# Patient Record
Sex: Male | Born: 1955 | Race: Black or African American | Hispanic: No | Smoking: Never smoker
Health system: Southern US, Community
[De-identification: ages and names within clinical notes are randomized; demographics above are authoritative.]

## PROBLEM LIST (undated history)

## (undated) DIAGNOSIS — E119 Type 2 diabetes mellitus without complications: Secondary | ICD-10-CM

## (undated) DIAGNOSIS — B029 Zoster without complications: Secondary | ICD-10-CM

## (undated) DIAGNOSIS — I10 Essential (primary) hypertension: Secondary | ICD-10-CM

## (undated) DIAGNOSIS — I639 Cerebral infarction, unspecified: Secondary | ICD-10-CM

## (undated) HISTORY — DX: Cerebral infarction, unspecified: I63.9

---

## 2012-06-17 ENCOUNTER — Emergency Department (HOSPITAL_COMMUNITY)
Admission: EM | Admit: 2012-06-17 | Discharge: 2012-06-17 | Disposition: A | Discharge status: Home / Self Care | Payer: Self-pay | Source: Home / Self Care

## 2012-06-17 ENCOUNTER — Encounter (HOSPITAL_COMMUNITY): Payer: Self-pay

## 2012-06-17 DIAGNOSIS — E785 Hyperlipidemia, unspecified: Secondary | ICD-10-CM

## 2012-06-17 DIAGNOSIS — Z23 Encounter for immunization: Secondary | ICD-10-CM

## 2012-06-17 DIAGNOSIS — G4700 Insomnia, unspecified: Secondary | ICD-10-CM

## 2012-06-17 DIAGNOSIS — I10 Essential (primary) hypertension: Secondary | ICD-10-CM

## 2012-06-17 HISTORY — DX: Essential (primary) hypertension: I10

## 2012-06-17 LAB — LIPID PANEL
HDL: 56 mg/dL (ref >39)
LDL Cholesterol: 133 mg/dL — ABNORMAL HIGH (ref 0–99)
Total CHOL/HDL Ratio: 4.1 RATIO
Triglycerides: 208 mg/dL — ABNORMAL HIGH (ref <150)
VLDL: 42 mg/dL — ABNORMAL HIGH (ref 0–40)

## 2012-06-17 LAB — COMPREHENSIVE METABOLIC PANEL
AST: 44 U/L — ABNORMAL HIGH (ref 0–37)
Albumin: 4.2 g/dL (ref 3.5–5.2)
Calcium: 10 mg/dL (ref 8.4–10.5)
Chloride: 96 mEq/L (ref 96–112)
Creatinine, Ser: 1.45 mg/dL — ABNORMAL HIGH (ref 0.50–1.35)
Total Protein: 8.3 g/dL (ref 6.0–8.3)

## 2012-06-17 MED ORDER — PRAVASTATIN SODIUM 20 MG PO TABS: 20.0000 mg | ORAL_TABLET | Freq: Every day | ORAL | Status: DC

## 2012-06-17 MED ORDER — ATENOLOL 50 MG PO TABS: 50.0000 mg | ORAL_TABLET | Freq: Every day | ORAL | Status: DC

## 2012-06-17 MED ORDER — LISINOPRIL-HYDROCHLOROTHIAZIDE 20-25 MG PO TABS: 1.0000 | ORAL_TABLET | Freq: Every day | ORAL | Status: DC

## 2012-06-17 MED ORDER — TRAZODONE HCL 100 MG PO TABS: 100.0000 mg | ORAL_TABLET | Freq: Every day | ORAL | Status: DC

## 2012-06-17 MED ORDER — INFLUENZA VIRUS VACC SPLIT PF IM SUSP
0.5000 mL | Freq: Once | INTRAMUSCULAR | Status: AC
  Administered 2012-06-17: 0.5 mL via INTRAMUSCULAR

## 2012-06-17 NOTE — ED Provider Notes (Signed)
History    CSN: [id number]  Arrival date & time 06/17/12  1700  Chief Complaint  Patient presents with  . Medication Refill   HPI Pt says he is out of all of his medications.  He has been treated for lipids and blood pressure.  The patient says that he tolerates his medications very well with no significant side effects.  Past Medical History  Diagnosis Date  . Hypertension    History reviewed. No pertinent past surgical history.  No family history on file.  History  Substance Use Topics  . Smoking status: Never Smoker   . Smokeless tobacco: Not on file  . Alcohol Use: No    Review of Systems  Constitutional: Negative.   HENT: Negative.   Respiratory: Negative.   Cardiovascular: Negative.   Gastrointestinal: Negative.   Musculoskeletal: Negative.   Neurological: Negative.   Hematological: Negative.   Psychiatric/Behavioral: Negative.   All other systems reviewed and are negative.   Allergies  Penicillins  Home Medications   Current Outpatient Rx  Name  Route  Sig  Dispense  Refill  . ATENOLOL 50 MG PO TABS   Oral   Take 50 mg by mouth daily.         [name] LISINOPRIL-HYDROCHLOROTHIAZIDE 20-25 MG PO TABS   Oral   Take 1 tablet by mouth daily.         [name] PRAVASTATIN SODIUM 20 MG PO TABS   Oral   Take 20 mg by mouth daily.         . TRAZODONE HCL 100 MG PO TABS   Oral   Take 100 mg by mouth at bedtime.           BP 140/83  Pulse 85  Temp 98.2 F (36.8 C) (Oral)  Resp 19  SpO2 99%  Physical Exam  Nursing note and vitals reviewed. Constitutional: He is oriented to person, place, and time. He appears well-developed and well-nourished. No distress.  HENT:  Head: Normocephalic and atraumatic.  Eyes: EOM are normal. Pupils are equal, round, and reactive to light.  Neck: Normal range of motion. Neck supple.  Cardiovascular: Normal rate, regular rhythm and normal heart sounds.   Pulmonary/Chest: Effort normal.  Abdominal: Soft. Bowel sounds are  normal.  Musculoskeletal: Normal range of motion. He exhibits no edema and no tenderness.  Neurological: He is alert and oriented to person, place, and time. He has normal reflexes.  Skin: Skin is warm and dry. No rash noted. No erythema. No pallor.  Psychiatric: He has a normal mood and affect. His behavior is normal. Judgment and thought content normal.    ED Course  Procedures (including critical care time)  Labs Reviewed - No data to display No results found.  No diagnosis found.  MDM  IMPRESSION  HTN  Hyperlipidemia  Health maintenance  RECOMMENDATIONS / PLAN Check labs today  Refilled meds  Flu vaccine ordered for today. Followup on results.  FOLLOW UP 3 months  The patient was given clear instructions to go to ER or return to medical center if symptoms don't improve, worsen or new problems develop.  The patient verbalized understanding.  The patient was told to call to get lab results if they haven't heard anything in the next week.            Cleora Fleet, MD 06/17/12 1755

## 2012-06-17 NOTE — ED Notes (Signed)
[facility]- needs medication refill has history of hypertension elevated cholesterol

## 2012-06-18 NOTE — Progress Notes (Signed)
Quick Note:  Please notify patient that his kidney test came back abnormal. This is consistent with mild renal insufficiency. Also, his liver enzymes were mildly elevated as well. I recommended rechecking his blood work in 3 months. Continue taking current medications. The patient's cholesterol is also elevated. I recommend low fat low cholesterol diet.   Rodney Langton, MD, CDE, FAAFP [facility] [place], [place]   ______

## 2012-06-19 ENCOUNTER — Telehealth (HOSPITAL_COMMUNITY): Payer: Self-pay

## 2012-06-19 NOTE — Telephone Encounter (Signed)
Message copied by Lestine Mount on Wed Jun 19, 2012 11:03 AM ------      Message from: Cleora Fleet      Created: Tue Jun 18, 2012  6:42 PM       Please notify patient that his kidney test came back abnormal.  This is consistent with mild renal insufficiency.  Also, his liver enzymes were mildly elevated as well.  I recommended rechecking his blood work in 3 months.  Continue taking current medications.  The patient's cholesterol is also elevated.  I recommend low fat low cholesterol diet.                  Rodney Langton, MD, CDE, FAAFP      [facility]      [facility]      [place], [place]

## 2012-06-19 NOTE — Telephone Encounter (Signed)
Message copied by Lestine Mount on Wed Jun 19, 2012 11:05 AM ------      Message from: Cleora Fleet      Created: Tue Jun 18, 2012  6:42 PM       Please notify patient that his kidney test came back abnormal.  This is consistent with mild renal insufficiency.  Also, his liver enzymes were mildly elevated as well.  I recommended rechecking his blood work in 3 months.  Continue taking current medications.  The patient's cholesterol is also elevated.  I recommend low fat low cholesterol diet.                  Rodney Langton, MD, CDE, FAAFP      [facility]      [facility]      [place], [place]

## 2014-08-25 ENCOUNTER — Emergency Department (HOSPITAL_COMMUNITY)
Admission: EM | Admit: 2014-08-25 | Discharge: 2014-08-25 | Disposition: A | Discharge status: Home / Self Care | Payer: No Typology Code available for payment source | Attending: Emergency Medicine | Admitting: Emergency Medicine

## 2014-08-25 ENCOUNTER — Encounter (HOSPITAL_COMMUNITY): Payer: Self-pay

## 2014-08-25 DIAGNOSIS — Z880 Allergy status to penicillin: Secondary | ICD-10-CM | POA: Insufficient documentation

## 2014-08-25 DIAGNOSIS — M62838 Other muscle spasm: Secondary | ICD-10-CM | POA: Diagnosis not present

## 2014-08-25 DIAGNOSIS — Y9389 Activity, other specified: Secondary | ICD-10-CM | POA: Diagnosis not present

## 2014-08-25 DIAGNOSIS — Y998 Other external cause status: Secondary | ICD-10-CM | POA: Diagnosis not present

## 2014-08-25 DIAGNOSIS — Y92410 Unspecified street and highway as the place of occurrence of the external cause: Secondary | ICD-10-CM | POA: Diagnosis not present

## 2014-08-25 DIAGNOSIS — Z79899 Other long term (current) drug therapy: Secondary | ICD-10-CM | POA: Diagnosis not present

## 2014-08-25 DIAGNOSIS — S199XXA Unspecified injury of neck, initial encounter: Secondary | ICD-10-CM | POA: Insufficient documentation

## 2014-08-25 DIAGNOSIS — I10 Essential (primary) hypertension: Secondary | ICD-10-CM | POA: Insufficient documentation

## 2014-08-25 DIAGNOSIS — S46811A Strain of other muscles, fascia and tendons at shoulder and upper arm level, right arm, initial encounter: Secondary | ICD-10-CM

## 2014-08-25 DIAGNOSIS — S29012A Strain of muscle and tendon of back wall of thorax, initial encounter: Secondary | ICD-10-CM | POA: Insufficient documentation

## 2014-08-25 DIAGNOSIS — S4991XA Unspecified injury of right shoulder and upper arm, initial encounter: Secondary | ICD-10-CM | POA: Diagnosis present

## 2014-08-25 MED ORDER — CYCLOBENZAPRINE HCL 10 MG PO TABS: 10.0000 mg | ORAL_TABLET | Freq: Three times a day (TID) | ORAL | Status: DC | PRN

## 2014-08-25 MED ORDER — NAPROXEN 500 MG PO TABS
500.0000 mg | ORAL_TABLET | Freq: Once | ORAL | Status: AC
  Administered 2014-08-25: 500 mg via ORAL
  Filled 2014-08-25: qty 1

## 2014-08-25 MED ORDER — HYDROCODONE-ACETAMINOPHEN 5-325 MG PO TABS: 1.0000 | ORAL_TABLET | Freq: Four times a day (QID) | ORAL | Status: DC | PRN

## 2014-08-25 MED ORDER — NAPROXEN 500 MG PO TABS: 500.0000 mg | ORAL_TABLET | Freq: Two times a day (BID) | ORAL | Status: DC | PRN
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
## 2014-08-27 ENCOUNTER — Emergency Department (HOSPITAL_COMMUNITY)
Admission: EM | Admit: 2014-08-27 | Discharge: 2014-08-27 | Discharge status: Absent without leave | Payer: Self-pay | Source: Home / Self Care

## 2014-08-29 ENCOUNTER — Encounter (HOSPITAL_COMMUNITY): Payer: Self-pay | Admitting: *Deleted

## 2014-08-29 ENCOUNTER — Emergency Department (HOSPITAL_COMMUNITY)
Admission: EM | Admit: 2014-08-29 | Discharge: 2014-08-29 | Disposition: A | Discharge status: Home / Self Care | Payer: Self-pay | Attending: Emergency Medicine | Admitting: Emergency Medicine

## 2014-08-29 ENCOUNTER — Emergency Department (HOSPITAL_COMMUNITY): Payer: Self-pay

## 2014-08-29 DIAGNOSIS — S161XXD Strain of muscle, fascia and tendon at neck level, subsequent encounter: Secondary | ICD-10-CM | POA: Insufficient documentation

## 2014-08-29 DIAGNOSIS — Z79899 Other long term (current) drug therapy: Secondary | ICD-10-CM | POA: Insufficient documentation

## 2014-08-29 DIAGNOSIS — I10 Essential (primary) hypertension: Secondary | ICD-10-CM | POA: Insufficient documentation

## 2014-08-29 DIAGNOSIS — M62838 Other muscle spasm: Secondary | ICD-10-CM | POA: Insufficient documentation

## 2014-08-29 DIAGNOSIS — Z880 Allergy status to penicillin: Secondary | ICD-10-CM | POA: Insufficient documentation

## 2014-08-29 MED ORDER — HYDROCODONE-ACETAMINOPHEN 5-325 MG PO TABS
1.0000 | ORAL_TABLET | Freq: Once | ORAL | Status: AC
Start: 2014-08-29 — End: 2014-08-29
  Administered 2014-08-29: 1 via ORAL
  Filled 2014-08-29: qty 1

## 2014-08-29 MED ORDER — HYDROCODONE-ACETAMINOPHEN 5-325 MG PO TABS: 1.0000 | ORAL_TABLET | ORAL | Status: DC | PRN
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
## 2014-08-29 NOTE — Discharge Instructions (Signed)
Return to the ED with any concerns including weakness of arms or legs, difficulty breathing, swelling of arms, decreased level of alertness/lethargy, or any other alarming symptoms

## 2015-05-29 ENCOUNTER — Encounter (HOSPITAL_COMMUNITY): Payer: Self-pay | Admitting: Emergency Medicine

## 2015-05-29 ENCOUNTER — Emergency Department (HOSPITAL_COMMUNITY)
Admission: EM | Admit: 2015-05-29 | Discharge: 2015-05-29 | Disposition: A | Discharge status: Home / Self Care | Payer: No Typology Code available for payment source | Attending: Emergency Medicine | Admitting: Emergency Medicine

## 2015-05-29 DIAGNOSIS — S161XXA Strain of muscle, fascia and tendon at neck level, initial encounter: Secondary | ICD-10-CM | POA: Diagnosis not present

## 2015-05-29 DIAGNOSIS — Y92410 Unspecified street and highway as the place of occurrence of the external cause: Secondary | ICD-10-CM | POA: Insufficient documentation

## 2015-05-29 DIAGNOSIS — Y998 Other external cause status: Secondary | ICD-10-CM | POA: Diagnosis not present

## 2015-05-29 DIAGNOSIS — Z79899 Other long term (current) drug therapy: Secondary | ICD-10-CM | POA: Insufficient documentation

## 2015-05-29 DIAGNOSIS — Y9389 Activity, other specified: Secondary | ICD-10-CM | POA: Diagnosis not present

## 2015-05-29 DIAGNOSIS — Z880 Allergy status to penicillin: Secondary | ICD-10-CM | POA: Insufficient documentation

## 2015-05-29 DIAGNOSIS — S199XXA Unspecified injury of neck, initial encounter: Secondary | ICD-10-CM | POA: Diagnosis present

## 2015-05-29 DIAGNOSIS — I10 Essential (primary) hypertension: Secondary | ICD-10-CM | POA: Insufficient documentation

## 2015-05-29 DIAGNOSIS — S134XXA Sprain of ligaments of cervical spine, initial encounter: Secondary | ICD-10-CM | POA: Diagnosis not present

## 2015-05-29 MED ORDER — HYDROCODONE-ACETAMINOPHEN 5-325 MG PO TABS: ORAL_TABLET | ORAL | Status: DC

## 2015-05-29 MED ORDER — IBUPROFEN 200 MG PO TABS
400.0000 mg | ORAL_TABLET | Freq: Once | ORAL | Status: AC
  Administered 2015-05-29: 400 mg via ORAL
  Filled 2015-05-29: qty 2
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
## 2015-05-29 NOTE — Discharge Instructions (Signed)
Take vicodin for breakthrough pain, do not drink alcohol, drive, care for children or do other critical tasks while taking vicodin.  Do not hesitate to return to the emergency room for any new, worsening or concerning symptoms.  Please obtain primary care using resource guide below. Let them know that you were seen in the emergency room and that they will need to obtain records for further outpatient management.     Cervical Sprain A cervical sprain is when the tissues (ligaments) that hold the neck bones in place stretch or tear. HOME CARE   Put ice on the injured area.  Put ice in a plastic bag.  Place a towel between your skin and the bag.  Leave the ice on for 15-20 minutes, 3-4 times a day.  You may have been given a collar to wear. This collar keeps your neck from moving while you heal.  Do not take the collar off unless told by your doctor.  If you have long hair, keep it outside of the collar.  Ask your doctor before changing the position of your collar. You may need to change its position over time to make it more comfortable.  If you are allowed to take off the collar for cleaning or bathing, follow your doctor's instructions on how to do it safely.  Keep your collar clean by wiping it with mild soap and water. Dry it completely. If the collar has removable pads, remove them every 1-2 days to hand wash them with soap and water. Allow them to air dry. They should be dry before you wear them in the collar.  Do not drive while wearing the collar.  Only take medicine as told by your doctor.  Keep all doctor visits as told.  Keep all physical therapy visits as told.  Adjust your work station so that you have good posture while you work.  Avoid positions and activities that make your problems worse.  Warm up and stretch before being active. GET HELP IF:  Your pain is not controlled with medicine.  You cannot take less pain medicine over time as planned.  Your  activity level does not improve as expected. GET HELP RIGHT AWAY IF:   You are bleeding.  Your stomach is upset.  You have an allergic reaction to your medicine.  You develop new problems that you cannot explain.  You lose feeling (become numb) or you cannot move any part of your body (paralysis).  You have tingling or weakness in any part of your body.  Your symptoms get worse. Symptoms include:  Pain, soreness, stiffness, puffiness (swelling), or a burning feeling in your neck.  Pain when your neck is touched.  Shoulder or upper back pain.  Limited ability to move your neck.  Headache.  Dizziness.  Your hands or arms feel week, lose feeling, or tingle.  Muscle spasms.  Difficulty swallowing or chewing. MAKE SURE YOU:   Understand these instructions.  Will watch your condition.  Will get help right away if you are not doing well or get worse.   This information is not intended to replace advice given to you by your health care provider. Make sure you discuss any questions you have with your health care provider.   Document Released: 11/08/2007 Document Revised: 01/22/2013 Document Reviewed: 11/27/2012 Elsevier Interactive Patient Education 2016 [employer]   Emergency Department Resource Guide 1) Find a Doctor and Pay Out of Pocket Although you won't have to find out who is covered by  your insurance plan, it is a good idea to ask around and get recommendations. You will then need to call the office and see if the doctor you have chosen will accept you as a new patient and what types of options they offer for patients who are self-pay. Some doctors offer discounts or will set up payment plans for their patients who do not have insurance, but you will need to ask so you aren't surprised when you get to your appointment.  2) Contact Your Local Health Department Not all health departments have doctors that can see patients for sick visits, but many do, so it is  worth a call to see if yours does. If you don't know where your local health department is, you can check in your phone book. The CDC also has a tool to help you locate your state's health department, and many state websites also have listings of all of their local health departments.  3) Find a Walk-in Clinic If your illness is not likely to be very severe or complicated, you may want to try a walk in clinic. These are popping up all over the country in pharmacies, drugstores, and shopping centers. They're usually staffed by nurse practitioners or physician assistants that have been trained to treat common illnesses and complaints. They're usually fairly quick and inexpensive. However, if you have serious medical issues or chronic medical problems, these are probably not your best option.  No Primary Care Doctor: - Call Health Connect at  [phone] - they can help you locate a primary care doctor that  accepts your insurance, provides certain services, etc. - Physician Referral Service- [phone]  Chronic Pain Problems: Organization         Address  Phone   Notes  [facility]  [phone] Patients need to be referred by their primary care doctor.   Medication Assistance: Organization         Address  Phone   Notes  [facility] Medication [facility] [address] [place] [place], [place] [address] [phone] --Must be a resident of [facility] -- Must have NO insurance coverage whatsoever (no Medicaid/ Medicare, etc.) -- The pt. MUST have a primary care doctor that directs their care regularly and follows them in the community   [facility]  [phone]   [employer]  [phone]    Agencies that provide inexpensive medical care: Organization         Address  Phone   Notes  [facility]  [phone]   [facility]    [phone]   [facility] [address] [place],  [place] [address] [phone]   [facility] [address] [place] [address] [place] [phone]   Planned Parenthood    [phone]   [facility]    [phone]   [facility]  [address], [place] Phone:  [phone], Fax:  [phone] Hours of Operation:  9 am - 6 pm, M-F.  Also accepts Medicaid/Medicare and self-pay.  [facility]  [address], [place] Phone: [phone], Fax: [phone]. Hours of Operation:  8:30 am - 5:30 pm, M-F.  Also accepts Medicaid and self-pay.  [facility] [place] [address] [place] Phone: [phone]   [facility] [address] Natasha Bence [place], [place] [phone], Ext. [phone] Mondays &  Thursdays: 7-9 AM.  First 15 patients are seen on a first come, first serve basis.    Medicaid-accepting [facility] Providers:  Organization         Address  Phone   Notes  [facility] [address], [place] [phone] Also accepts self-pay patients.  [facility] [address] Laurell Josephs [place], [place]  [phone]   [facility] [address], [place] [phone]   [facility] [address] [place] [phone]   Renaye Rakers [address], [place]   [phone] Only accepts [place] Access [place] patients after they have their name applied to their card.   Self-Pay (no insurance) in [facility]:  Organization         Address  Phone   Notes  Sickle Cell Patients, [facility] [address] [place], [place] [phone]   [facility] [address] [place], [place] [phone]   [facility] [place]  [address], [place] [phone]   [facility]/Dr. Osei-Bonsu  [address] [place] or [address], [place] [phone] Phone number for both [place] and [place] locations is the same.  [facility] and [facility] [address], [place] [phone]   [facility] [place] [address] [place] or [address] [phone]   [facility] [address] [place] [phone], phone; [phone], fax Sees patients 1st and 3rd Saturday of every month.  Must not qualify for public or private insurance (i.e. Medicaid, Medicare, [place] Health Choice, Veterans' Benefits)  Household income should be no more than 200% of the poverty level The clinic cannot treat you if you are pregnant or think you are pregnant  Sexually transmitted diseases are not treated at the clinic.    Dental Care: Organization         Address  Phone  Notes  [facility] Department of [facility] [address] [place], [place] [phone] Accepts children up to age 47 who are enrolled in [place] or [place] Health Choice; pregnant women with a Medicaid card; and children who have applied for Medicaid or [place] Health Choice, but were declined, whose parents can pay a reduced fee at time of service.  [facility] Department of [facility]  [address], [place] [phone] Accepts children up to age 84 who are enrolled in [place] or [place] Health Choice; pregnant women with a Medicaid card; and children who have applied for Medicaid or [place] Health Choice, but were declined, whose parents can pay a reduced fee at time of service.  [facility]  [address] [place], [place] [phone] Patients are seen by appointment only. Walk-ins are not accepted. [facility] will see patients 57 years of age and older. Monday - Tuesday (8am-5pm) Most Wednesdays (8:30-5pm) $30 per visit, cash only  [facility]  [address], [facility] [phone]  Patients are seen by appointment only. Walk-ins are not accepted. [facility] will see patients 88 years of age and older. One Wednesday Evening (Monthly: Volunteer Based).  $30 per visit, cash only  [employer]  [phone] for adults; Children under  age 68, call Graduate Pediatric Dentistry at [phone]. Children aged 90-14, please call [phone] to request a pediatric application.  Dental services are provided in all areas of dental care including fillings, crowns and bridges, complete and partial dentures, implants, gum treatment, root canals, and extractions. Preventive care is also provided. Treatment is provided to both adults and children. Patients are selected via a lottery and there is often a waiting list.   [facility] [address], [place]  [phone] [email or web address]   [facility] [address] [place], [place] [phone], Ext. [phone] Second and Fourth Thursday of each month, opens at 6:30 AM; Clinic ends at 9 AM.  Patients are seen on a first-come first-served basis, and a limited number are seen during each clinic.   [facility]  [address] Ether Griffins [place], [place] [phone]   Eligibility Requirements You must have lived in [place], [place], or [place] counties for at least the last three months.   You cannot be eligible for state or federal sponsored [place], including [employer], [place], or [employer].   You generally cannot be eligible for healthcare insurance through your employer.    How to apply: Eligibility screenings are held every Tuesday and Wednesday afternoon from 1:00 pm until 4:00 pm. You do not need an appointment for the interview!  [facility] [address] [place], [place] [phone]   [facility] Health Department  [phone]   [facility] Health Department  [phone]   [facility] Health Department   [phone]    Behavioral Health Resources in the Community: Intensive Outpatient Programs Organization         Address  Phone  Notes  [facility] Services [address] [place], [place] [phone]   [facility] [address], [place], [place] [phone]   [facility] [address], [place], [place]  [phone]   [facility] [address],  [place], [place] [phone] or [phone]   Substance Abuse Resources Organization         Address  Phone  Notes  [facility]  [phone]   [facility]  [phone]   [facility]  [phone]   Floydene Flock  [phone]   Residential & Outpatient Substance Abuse Program  7012752424   Psychological Services Organization         Address  Phone  Notes  [facility] Behavioral Health  [phone]   [facility] Services  [phone]   [facility] Mental Health [address], [place] [phone] or [phone]    Mobile Crisis Teams Organization         Address  Phone  Notes  [facility], Mobile Crisis Care Unit  [phone]   [facility]  [address] [place], [place] [phone]   Doristine Locks [address] [place] [phone]    Self-Help/Support Groups Organization         Address  Phone             Notes  [facility] [place] - variety of support groups  336- I7437963 Call for more information  Narcotics Anonymous (NA), [facility] [address], [employer] [place]  2 meetings at this location   Chief Executive Officer  Notes  [facility] [phone] [address]  Lynne [place]  [phone]   [facility]  [address] [place] [address], [place], [place] [phone]   [facility] [address] [place], [place] [phone] Admissions: 8am-3pm M-F   [facility] [address]    [place], [place] [phone]   [facility] [address] [place] [address] [place], [place], [place] [phone]   [facility] [address]  [place], [place] [phone]   [facility] - Intensive Outpatient [address] Laurell Josephs [address], [place], [place] [phone]   [facility] ([facility] [address] [place].,  [place], [place] [phone] or [phone]   [facility] ([facility]) [address], [place], [place] [phone] Accepts Medicaid  [facility] [place] [address]  [place] [phone] Substance Abuse/Addiction Treatment   [facility] Organization         Address  Phone  Notes  [facility]  [phone]   Angie Fava, PhD [address] Ervin Knack [place], [place]   [phone] or [phone]   [facility] Behavioral   [address] [place], [place] [phone]   [facility] [address] [place], [place] [phone] Insurance/Medicaid/sponsorship through [facility] and Families [address]                                    [place], [place] [phone] Therapy/tele-psych/case  [facility] [address] [place], [place] [phone]    Dr. Lolly Mustache  [phone]   [facility]  [employer] [facility] Dept. 1) [address] [place] 2) [address] [place] 3)  [address], [place] [phone]  [phone]   [facility] Child Abuse Hotline [phone] or [phone] (After Hours)
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
## 2015-05-29 NOTE — ED Notes (Signed)
Pt ambulatory to fast track
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
## 2015-08-07 ENCOUNTER — Emergency Department (HOSPITAL_COMMUNITY)
Admission: EM | Admit: 2015-08-07 | Discharge: 2015-08-07 | Disposition: A | Discharge status: Home / Self Care | Payer: No Typology Code available for payment source | Attending: Emergency Medicine | Admitting: Emergency Medicine

## 2015-08-07 ENCOUNTER — Encounter (HOSPITAL_COMMUNITY): Payer: Self-pay

## 2015-08-07 DIAGNOSIS — B029 Zoster without complications: Secondary | ICD-10-CM | POA: Insufficient documentation

## 2015-08-07 DIAGNOSIS — Z880 Allergy status to penicillin: Secondary | ICD-10-CM | POA: Insufficient documentation

## 2015-08-07 DIAGNOSIS — Z79899 Other long term (current) drug therapy: Secondary | ICD-10-CM | POA: Insufficient documentation

## 2015-08-07 DIAGNOSIS — I10 Essential (primary) hypertension: Secondary | ICD-10-CM | POA: Insufficient documentation

## 2015-08-07 MED ORDER — ACYCLOVIR 800 MG PO TABS: 800.0000 mg | ORAL_TABLET | Freq: Every day | ORAL | Status: DC

## 2015-08-07 MED ORDER — VALACYCLOVIR HCL 1 G PO TABS: 1000.0000 mg | ORAL_TABLET | Freq: Three times a day (TID) | ORAL | Status: DC

## 2015-08-07 MED ORDER — OXYCODONE-ACETAMINOPHEN 5-325 MG PO TABS: 1.0000 | ORAL_TABLET | ORAL | Status: DC | PRN

## 2015-08-07 NOTE — Discharge Instructions (Signed)

## 2015-08-07 NOTE — ED Notes (Signed)
He c/o painful "burning" rash at right lat. Thoracic/ribs area.  He states he is otherwise healthy.  He is in no distress.

## 2015-08-07 NOTE — ED Provider Notes (Signed)
CSN: [id number]     Arrival date & time 08/07/15  1333 History   None    Chief Complaint  Patient presents with  . Rash     (Consider location/radiation/quality/duration/timing/severity/associated sxs/prior Treatment) Patient is a 60 y.o. male presenting with rash. The history is provided by the patient.  Rash Location:  Torso Torso rash location:  R chest and upper back Quality: blistering and painful   Pain details:    Quality:  Burning   Severity:  Severe   Onset quality:  Gradual   Duration:  3 days   Timing:  Constant   Progression:  Worsening Onset quality:  Gradual Duration:  3 days Chronicity:  New Context: not animal contact, not chemical exposure, not diapers, not eggs, not exposure to similar rash, not food, not hot tub use, not insect bite/sting, not medications, not new detergent/soap, not nuts, not plant contact, not pollen, not pregnancy, not sick contacts and not sun exposure   Relieved by:  Nothing Worsened by:  Contact Associated symptoms: no abdominal pain, no diarrhea, no fatigue, no fever, no headaches, no hoarse voice, no induration, no nausea, no periorbital edema, no shortness of breath, no sore throat, no throat swelling, no tongue swelling, no URI, not vomiting and not wheezing     Past Medical History  Diagnosis Date  . Hypertension    No past surgical history on file. No family history on file. Social History  Substance Use Topics  . Smoking status: Never Smoker   . Smokeless tobacco: None  . Alcohol Use: No    Review of Systems  Constitutional: Negative for fever and fatigue.  HENT: Negative for hoarse voice and sore throat.   Respiratory: Negative for shortness of breath and wheezing.   Gastrointestinal: Negative for nausea, vomiting, abdominal pain and diarrhea.  Skin: Positive for rash.  Neurological: Negative for headaches.  All other systems reviewed and are negative.     Allergies  Penicillins  Home Medications   Prior to  Admission medications   Medication Sig Start Date End Date Taking? Authorizing Provider  atenolol (TENORMIN) 50 MG tablet Take 1 tablet (50 mg total) by mouth daily. 06/17/12   Clanford Cyndie MullL Johnson, MD  cyclobenzaprine (FLEXERIL) 10 MG tablet Take 1 tablet (10 mg total) by mouth 3 (three) times daily as needed for muscle spasms. 08/25/14   Mercedes Camprubi-Soms, PA-C  HYDROcodone-acetaminophen (NORCO/VICODIN) 5-325 MG tablet Take 1-2 tablets by mouth every 6 hours as needed for pain and/or cough. 05/29/15   Nicole Pisciotta, PA-C  lisinopril-hydrochlorothiazide (PRINZIDE,ZESTORETIC) 20-25 MG per tablet Take 1 tablet by mouth daily. Patient not taking: Reported on 08/25/2014 06/17/12   Clanford Cyndie MullL Johnson, MD  naproxen (NAPROSYN) 500 MG tablet Take 1 tablet (500 mg total) by mouth 2 (two) times daily as needed for mild pain, moderate pain or headache (TAKE WITH MEALS.). Patient not taking: Reported on 08/29/2014 08/25/14   Mercedes Camprubi-Soms, PA-C  pravastatin (PRAVACHOL) 20 MG tablet Take 1 tablet (20 mg total) by mouth daily. 06/17/12   Clanford Cyndie MullL Johnson, MD  traZODone (DESYREL) 100 MG tablet Take 1 tablet (100 mg total) by mouth at bedtime. Patient taking differently: Take 100 mg by mouth at bedtime as needed for sleep.  06/17/12   Clanford L Johnson, MD   BP 142/90 mmHg  Pulse 101  Temp(Src) 97.6 F (36.4 C) (Oral)  Resp 18  SpO2 98% Physical Exam  Constitutional: He appears well-developed and well-nourished. No distress.  HENT:  Head: Normocephalic and  atraumatic.  Eyes: Conjunctivae are normal. No scleral icterus.  Neck: Normal range of motion. Neck supple.  Cardiovascular: Normal rate, regular rhythm and normal heart sounds.   Pulmonary/Chest: Effort normal and breath sounds normal. No respiratory distress.      Patches of vesicular and crusting eruptions on the R side in the T7/T8 dermatome. No signs of secondary infection.  Abdominal: Soft. There is no tenderness.    Musculoskeletal: He exhibits no edema.  Neurological: He is alert.  Skin: Skin is warm and dry. He is not diaphoretic.  Psychiatric: His behavior is normal.  Nursing note and vitals reviewed.   ED Course  Procedures (including critical care time) Labs Review Labs Reviewed - No data to display  Imaging Review No results found. I have personally reviewed and evaluated these images and lab results as part of my medical decision-making.   EKG Interpretation None      MDM   Final diagnoses:  Shingles outbreak    3:28 PM BP 155/78 mmHg  Pulse 77  Temp(Src) 98 F (36.7 C) (Oral)  Resp 16  SpO2 99%  Patient discharged with zovirax and pain meds. Discussed return precautions. Appears safe for discharge.    Arthor Captain, PA-C 08/07/15 1529  Lavera Guise, MD 08/07/15 [phone]

## 2015-08-29 ENCOUNTER — Encounter (HOSPITAL_COMMUNITY): Payer: Self-pay

## 2015-08-29 ENCOUNTER — Emergency Department (HOSPITAL_COMMUNITY)
Admission: EM | Admit: 2015-08-29 | Discharge: 2015-08-29 | Disposition: A | Discharge status: Home / Self Care | Payer: No Typology Code available for payment source | Attending: Emergency Medicine | Admitting: Emergency Medicine

## 2015-08-29 DIAGNOSIS — B029 Zoster without complications: Secondary | ICD-10-CM | POA: Insufficient documentation

## 2015-08-29 DIAGNOSIS — I10 Essential (primary) hypertension: Secondary | ICD-10-CM | POA: Insufficient documentation

## 2015-08-29 DIAGNOSIS — R0789 Other chest pain: Secondary | ICD-10-CM | POA: Insufficient documentation

## 2015-08-29 DIAGNOSIS — Z880 Allergy status to penicillin: Secondary | ICD-10-CM | POA: Insufficient documentation

## 2015-08-29 DIAGNOSIS — M792 Neuralgia and neuritis, unspecified: Secondary | ICD-10-CM

## 2015-08-29 DIAGNOSIS — Z8619 Personal history of other infectious and parasitic diseases: Secondary | ICD-10-CM | POA: Insufficient documentation

## 2015-08-29 DIAGNOSIS — Z79899 Other long term (current) drug therapy: Secondary | ICD-10-CM | POA: Insufficient documentation

## 2015-08-29 HISTORY — DX: Zoster without complications: B02.9

## 2015-08-29 MED ORDER — OXYCODONE-ACETAMINOPHEN 5-325 MG PO TABS: 2.0000 | ORAL_TABLET | ORAL | Status: DC | PRN

## 2015-08-29 MED ORDER — IBUPROFEN 800 MG PO TABS: 800.0000 mg | ORAL_TABLET | Freq: Three times a day (TID) | ORAL | Status: DC

## 2015-08-29 NOTE — Discharge Instructions (Signed)
1. Medications: pain medicine, ibuprofen, usual home medications 2. Treatment: rest, drink plenty of fluids 3. Follow Up: please followup with your primary doctor for discussion of your diagnoses and further evaluation after today's visit; if you do not have a primary care doctor use the phone number listed in your discharge paperwork to find one; please return to the ER for high fever, increased pain, new or worsening symptoms

## 2015-08-29 NOTE — ED Provider Notes (Signed)
[id number]     Arrival date & time 08/29/15  0909 History   First MD Initiated Contact with Patient 08/29/15 [phone]     Chief Complaint  Patient presents with  . Rash    HPI   Andrew Wang is a 60 y.o. male with a PMH of HTN and shingles who presents to the ED with pain to his right lateral and posterior chest wall. He was evaluated in the ED 3/4 and diagnosed with shingles. He states he completed his acyclovir course and reports significant improvement in his symptoms, though notes persistent pain. He denies chest pain, shortness of breath, abdominal pain, N/V, numbness, weakness, paresthesia.   Past Medical History  Diagnosis Date  . Hypertension   . Shingles    No past surgical history on file. No family history on file. Social History  Substance Use Topics  . Smoking status: Never Smoker   . Smokeless tobacco: None  . Alcohol Use: No      Review of Systems  Constitutional: Negative for fever and chills.  Respiratory: Negative for shortness of breath.   Cardiovascular: Negative for chest pain.  Gastrointestinal: Negative for nausea, vomiting and abdominal pain.  Musculoskeletal:       Pain to right lateral and posterior chest wall.  Skin: Positive for rash.  Neurological: Negative for weakness and numbness.  All other systems reviewed and are negative.     Allergies  Penicillins  Home Medications   Prior to Admission medications   Medication Sig Start Date End Date Taking? Authorizing Provider  acyclovir (ZOVIRAX) 800 MG tablet Take 1 tablet (800 mg total) by mouth 5 (five) times daily. For 10 days. 08/07/15   Arthor Captain, PA-C  atenolol (TENORMIN) 50 MG tablet Take 1 tablet (50 mg total) by mouth daily. 06/17/12   Clanford Cyndie Mull, MD  cyclobenzaprine (FLEXERIL) 10 MG tablet Take 1 tablet (10 mg total) by mouth 3 (three) times daily as needed for muscle spasms. 08/25/14   Mercedes Camprubi-Soms, PA-C  ibuprofen (ADVIL,MOTRIN) 800 MG tablet Take 1 tablet (800  mg total) by mouth 3 (three) times daily. 08/29/15   Mady Gemma, PA-C  lisinopril-hydrochlorothiazide (PRINZIDE,ZESTORETIC) 20-25 MG per tablet Take 1 tablet by mouth daily. Patient not taking: Reported on 08/25/2014 06/17/12   Clanford Cyndie Mull, MD  naproxen (NAPROSYN) 500 MG tablet Take 1 tablet (500 mg total) by mouth 2 (two) times daily as needed for mild pain, moderate pain or headache (TAKE WITH MEALS.). Patient not taking: Reported on 08/29/2014 08/25/14   Mercedes Camprubi-Soms, PA-C  oxyCODONE-acetaminophen (PERCOCET/ROXICET) 5-325 MG tablet Take 2 tablets by mouth every 4 (four) hours as needed for severe pain. 08/29/15   Mady Gemma, PA-C  pravastatin (PRAVACHOL) 20 MG tablet Take 1 tablet (20 mg total) by mouth daily. 06/17/12   Clanford Cyndie Mull, MD  traZODone (DESYREL) 100 MG tablet Take 1 tablet (100 mg total) by mouth at bedtime. Patient taking differently: Take 100 mg by mouth at bedtime as needed for sleep.  06/17/12   Clanford Cyndie Mull, MD  valACYclovir (VALTREX) 1000 MG tablet Take 1 tablet (1,000 mg total) by mouth 3 (three) times daily. 08/07/15   Abigail Harris, PA-C    BP 135/91 mmHg  Pulse 84  Temp(Src) 98 F (36.7 C) (Oral)  Resp 18  SpO2 100% Physical Exam  Constitutional: He is oriented to person, place, and time. He appears well-developed and well-nourished. No distress.  HENT:  Head: Normocephalic and atraumatic.  Right  Ear: External ear normal.  Left Ear: External ear normal.  Nose: Nose normal.  Mouth/Throat: Uvula is midline, oropharynx is clear and moist and mucous membranes are normal.  Eyes: Conjunctivae, EOM and lids are normal. Pupils are equal, round, and reactive to light. Right eye exhibits no discharge. Left eye exhibits no discharge. No scleral icterus.  Neck: Normal range of motion. Neck supple.  Cardiovascular: Normal rate, regular rhythm, normal heart sounds, intact distal pulses and normal pulses.   Pulmonary/Chest: Effort normal  and breath sounds normal. No respiratory distress. He has no wheezes. He has no rales.      Crusted over lesions to right chest wall with mild TTP. No vesicles. No erythema, edema, or discharge.  Abdominal: Soft. Normal appearance and bowel sounds are normal. He exhibits no distension and no mass. There is no tenderness. There is no rigidity, no rebound and no guarding.  Musculoskeletal: Normal range of motion. He exhibits no edema or tenderness.  Neurological: He is alert and oriented to person, place, and time.  Skin: Skin is warm, dry and intact. No rash noted. He is not diaphoretic. No erythema. No pallor.  Psychiatric: He has a normal mood and affect. His speech is normal and behavior is normal.  Nursing note and vitals reviewed.   ED Course  Procedures (including critical care time)  Labs Review Labs Reviewed - No data to display  Imaging Review No results found.    EKG Interpretation None      MDM   Final diagnoses:  Neuralgia    60 year old male presents with persistent pain s/p being diagnosed with and treated for shingles. States he completed his acyclovir course and notes significant symptom improvement, though reports he has some residual pain. Patient is afebrile. Vital signs stable. Heart RRR. Lungs clear to auscultation bilaterally. Abdomen soft, non-tender, non-distended. On exam, patient has crusted over lesions to his right lateral and posterior chest wall with mild TTP. No vesicles. No evidence of new infection or secondary infection. Patient is non-toxic and well-appearing, feel he is stable for discharge at this time. Will give short course of pain medication and anti-inflammatory. Patient to follow-up with PCP. Return precautions discussed. Patient verbalizes his understanding and is in agreement with plan.  BP 135/91 mmHg  Pulse 84  Temp(Src) 98 F (36.7 C) (Oral)  Resp 18  SpO2 100%     Mady Gemmalizabeth C Lequisha Cammack, PA-C 08/29/15 1252  Cathren LaineKevin Steinl,  MD 08/31/15 1143

## 2015-08-29 NOTE — ED Notes (Signed)
He c/o persistent discomfort at right thorax area.  He was recently treated here for shingles in this area.  All lesions are on the wane with no areas of drainage, nor any vesicles.  He is in no distress and states he is otherwise healthy.

## 2015-10-22 ENCOUNTER — Encounter (HOSPITAL_COMMUNITY): Payer: Self-pay

## 2015-10-22 ENCOUNTER — Emergency Department (HOSPITAL_COMMUNITY)
Admission: EM | Admit: 2015-10-22 | Discharge: 2015-10-22 | Disposition: A | Discharge status: Home / Self Care | Payer: No Typology Code available for payment source | Attending: Emergency Medicine | Admitting: Emergency Medicine

## 2015-10-22 DIAGNOSIS — Z79899 Other long term (current) drug therapy: Secondary | ICD-10-CM | POA: Insufficient documentation

## 2015-10-22 DIAGNOSIS — B029 Zoster without complications: Secondary | ICD-10-CM

## 2015-10-22 DIAGNOSIS — I10 Essential (primary) hypertension: Secondary | ICD-10-CM | POA: Insufficient documentation

## 2015-10-22 LAB — I-STAT CHEM 8, ED
BUN: 9 mg/dL (ref 6–20)
CREATININE: 1.2 mg/dL (ref 0.61–1.24)
Calcium, Ion: 1.12 mmol/L — ABNORMAL LOW (ref 1.13–1.30)
Chloride: 96 mmol/L — ABNORMAL LOW (ref 101–111)
GLUCOSE: 169 mg/dL — AB (ref 65–99)
HCT: 52 % (ref 39.0–52.0)
HEMOGLOBIN: 17.7 g/dL — AB (ref 13.0–17.0)
Potassium: 3.4 mmol/L — ABNORMAL LOW (ref 3.5–5.1)
Sodium: 139 mmol/L (ref 135–145)
TCO2: 31 mmol/L (ref 0–100)

## 2015-10-22 LAB — CBC WITH DIFFERENTIAL/PLATELET
BASOS ABS: 0.1 10*3/uL (ref 0.0–0.1)
BASOS PCT: 1 %
EOS ABS: 0.3 10*3/uL (ref 0.0–0.7)
Eosinophils Relative: 3 %
HCT: 48.3 % (ref 39.0–52.0)
HEMOGLOBIN: 17.6 g/dL — AB (ref 13.0–17.0)
Lymphocytes Relative: 45 %
Lymphs Abs: 4.5 10*3/uL — ABNORMAL HIGH (ref 0.7–4.0)
MCH: 31 pg (ref 26.0–34.0)
MCHC: 36.4 g/dL — AB (ref 30.0–36.0)
MCV: 85.2 fL (ref 78.0–100.0)
MONOS PCT: 7 %
Monocytes Absolute: 0.7 10*3/uL (ref 0.1–1.0)
NEUTROS PCT: 44 %
Neutro Abs: 4.3 10*3/uL (ref 1.7–7.7)
Platelets: 193 10*3/uL (ref 150–400)
RBC: 5.67 MIL/uL (ref 4.22–5.81)
RDW: 12.5 % (ref 11.5–15.5)
WBC: 9.8 10*3/uL (ref 4.0–10.5)

## 2015-10-22 LAB — RAPID HIV SCREEN (HIV 1/2 AB+AG)
HIV 1/2 ANTIBODIES: NONREACTIVE
HIV-1 P24 ANTIGEN - HIV24: NONREACTIVE

## 2015-10-22 MED ORDER — ACYCLOVIR 400 MG PO TABS
800.0000 mg | ORAL_TABLET | Freq: Once | ORAL | Status: AC
  Administered 2015-10-22: 800 mg via ORAL
  Filled 2015-10-22: qty 2

## 2015-10-22 MED ORDER — HYDROCODONE-ACETAMINOPHEN 5-325 MG PO TABS: 1.0000 | ORAL_TABLET | Freq: Four times a day (QID) | ORAL | Status: DC | PRN

## 2015-10-22 MED ORDER — PREDNISONE 20 MG PO TABS: 40.0000 mg | ORAL_TABLET | Freq: Every day | ORAL | Status: DC

## 2015-10-22 MED ORDER — ACYCLOVIR 800 MG PO TABS: 800.0000 mg | ORAL_TABLET | Freq: Every day | ORAL | Status: DC

## 2015-10-22 NOTE — ED Notes (Signed)
Pt has scars on his right side from shingles from a month ago, yesterday he noticed a rash on the same side again, he sais they are starting to itch and burn, not as bad. Pt also has a place on his right wrist that has a weepy scab that has gotten bigger in the last month

## 2015-10-22 NOTE — ED Notes (Signed)
HIV - nonreactive  Notified primary nurse

## 2015-10-22 NOTE — Discharge Instructions (Signed)
Take acyclovir as prescribed until all gone. Take prednisone as prescribed until all gone. Ibuprofen or tylenol for pain. Apply moisturizing cream to the skin lesions on your hand 2-3 times a day. Your blood sugar is slightly elevated today, make sure to watch your sugar/carbohydrate intake. Follow up with your doctor.    Shingles Shingles, which is also known as herpes zoster, is an infection that causes a painful skin rash and fluid-filled blisters. Shingles is not related to genital herpes, which is a sexually transmitted infection.   Shingles only develops in people who:  Have had chickenpox.  Have received the chickenpox vaccine. (This is rare.) CAUSES Shingles is caused by varicella-zoster virus (VZV). This is the same virus that causes chickenpox. After exposure to VZV, the virus stays in the body in an inactive (dormant) state. Shingles develops if the virus reactivates. This can happen many years after the initial exposure to VZV. It is not known what causes this virus to reactivate. RISK FACTORS People who have had chickenpox or received the chickenpox vaccine are at risk for shingles. Infection is more common in people who:  Are older than age 61.  Have a weakened defense (immune) system, such as those with HIV, AIDS, or cancer.  Are taking medicines that weaken the immune system, such as transplant medicines.  Are under great stress. SYMPTOMS Early symptoms of this condition include itching, tingling, and pain in an area on your skin. Pain may be described as burning, stabbing, or throbbing. A few days or weeks after symptoms start, a painful red rash appears, usually on one side of the body in a bandlike or beltlike pattern. The rash eventually turns into fluid-filled blisters that break open, scab over, and dry up in about 2-3 weeks. At any time during the infection, you may also develop:  A fever.  Chills.  A headache.  An upset stomach. DIAGNOSIS This condition is  diagnosed with a skin exam. Sometimes, skin or fluid samples are taken from the blisters before a diagnosis is made. These samples are examined under a microscope or sent to a lab for testing. TREATMENT There is no specific cure for this condition. Your health care provider will probably prescribe medicines to help you manage pain, recover more quickly, and avoid long-term problems. Medicines may include:  Antiviral drugs.  Anti-inflammatory drugs.  Pain medicines. If the area involved is on your face, you may be referred to a specialist, such as an eye doctor (ophthalmologist) or an ear, nose, and throat (ENT) doctor to help you avoid eye problems, chronic pain, or disability. HOME CARE INSTRUCTIONS Medicines  Take medicines only as directed by your health care provider.  Apply an anti-itch or numbing cream to the affected area as directed by your health care provider. Blister and Rash Care  Take a cool bath or apply cool compresses to the area of the rash or blisters as directed by your health care provider. This may help with pain and itching.  Keep your rash covered with a loose bandage (dressing). Wear loose-fitting clothing to help ease the pain of material rubbing against the rash.  Keep your rash and blisters clean with mild soap and cool water or as directed by your health care provider.  Check your rash every day for signs of infection. These include redness, swelling, and pain that lasts or increases.  Do not pick your blisters.  Do not scratch your rash. General Instructions  Rest as directed by your health care provider.  Keep all follow-up visits as directed by your health care provider. This is important.  Until your blisters scab over, your infection can cause chickenpox in people who have never had it or been vaccinated against it. To prevent this from happening, avoid contact with other people, especially:  Babies.  Pregnant women.  Children who have  eczema.  Elderly people who have transplants.  People who have chronic illnesses, such as leukemia or AIDS. SEEK MEDICAL CARE IF:  Your pain is not relieved with prescribed medicines.  Your pain does not get better after the rash heals.  Your rash looks infected. Signs of infection include redness, swelling, and pain that lasts or increases. SEEK IMMEDIATE MEDICAL CARE IF:  The rash is on your face or nose.  You have facial pain, pain around your eye area, or loss of feeling on one side of your face.  You have ear pain or you have ringing in your ear.  You have loss of taste.  Your condition gets worse.   This information is not intended to replace advice given to you by your health care provider. Make sure you discuss any questions you have with your health care provider.   Document Released: 05/22/2005 Document Revised: 06/12/2014 Document Reviewed: 04/02/2014 Elsevier Interactive Patient Education [employer].

## 2015-10-22 NOTE — ED Provider Notes (Signed)
CSN: [id number]     Arrival date & time 10/22/15  0557 History   First MD Initiated Contact with Patient 10/22/15 [phone]     Chief Complaint  Patient presents with  . Rash     (Consider location/radiation/quality/duration/timing/severity/associated sxs/prior Treatment) HPI Andrew Wang is a 60 y.o. male with history of hypertension, presents to emergency department complaining of painful rash to the right ribs and flank area. Patient states that he was recently diagnosed with shingles, 2 months ago. He finished a course of famciclovir. States rash has improved and scabbed. He continued to have some pain to the area, which has been improving. He states he has been putting essential oils to it. He states he has not used coils in several days. This morning she noticed increased burning in the same area and small bumps. He states area is very sensitive to the touch even when the shirt touches it. Patient is also complaining of a scabbed area to the right dorsal wrist, states he has been there for several weeks. He has been putting alcohol on it and started taking doxycycline that he found his cabinet yesterday. States the wound is scabbed and sometimes draining clear drainage.  Past Medical History  Diagnosis Date  . Hypertension   . Shingles    History reviewed. No pertinent past surgical history. History reviewed. No pertinent family history. Social History  Substance Use Topics  . Smoking status: Never Smoker   . Smokeless tobacco: None  . Alcohol Use: No    Review of Systems  Constitutional: Negative for fever and chills.  Respiratory: Negative for cough, chest tightness and shortness of breath.   Cardiovascular: Negative for chest pain, palpitations and leg swelling.  Musculoskeletal: Negative for myalgias, arthralgias, neck pain and neck stiffness.  Skin: Positive for rash.  Allergic/Immunologic: Negative for immunocompromised state.  Neurological: Negative for dizziness, weakness,  light-headedness, numbness and headaches.  All other systems reviewed and are negative.     Allergies  Penicillins  Home Medications   Prior to Admission medications   Medication Sig Start Date End Date Taking? Authorizing Provider  acyclovir (ZOVIRAX) 800 MG tablet Take 1 tablet (800 mg total) by mouth 5 (five) times daily. For 10 days. 08/07/15   Arthor Captain, PA-C  atenolol (TENORMIN) 50 MG tablet Take 1 tablet (50 mg total) by mouth daily. 06/17/12   Clanford Cyndie Mull, MD  cyclobenzaprine (FLEXERIL) 10 MG tablet Take 1 tablet (10 mg total) by mouth 3 (three) times daily as needed for muscle spasms. 08/25/14   Mercedes Camprubi-Soms, PA-C  ibuprofen (ADVIL,MOTRIN) 800 MG tablet Take 1 tablet (800 mg total) by mouth 3 (three) times daily. 08/29/15   Mady Gemma, PA-C  lisinopril-hydrochlorothiazide (PRINZIDE,ZESTORETIC) 20-25 MG per tablet Take 1 tablet by mouth daily. Patient not taking: Reported on 08/25/2014 06/17/12   Clanford Cyndie Mull, MD  naproxen (NAPROSYN) 500 MG tablet Take 1 tablet (500 mg total) by mouth 2 (two) times daily as needed for mild pain, moderate pain or headache (TAKE WITH MEALS.). Patient not taking: Reported on 08/29/2014 08/25/14   Mercedes Camprubi-Soms, PA-C  oxyCODONE-acetaminophen (PERCOCET/ROXICET) 5-325 MG tablet Take 2 tablets by mouth every 4 (four) hours as needed for severe pain. 08/29/15   Mady Gemma, PA-C  pravastatin (PRAVACHOL) 20 MG tablet Take 1 tablet (20 mg total) by mouth daily. 06/17/12   Clanford Cyndie Mull, MD  traZODone (DESYREL) 100 MG tablet Take 1 tablet (100 mg total) by mouth at bedtime. Patient taking differently:  Take 100 mg by mouth at bedtime as needed for sleep.  06/17/12   Clanford Cyndie MullL Johnson, MD  valACYclovir (VALTREX) 1000 MG tablet Take 1 tablet (1,000 mg total) by mouth 3 (three) times daily. 08/07/15   Abigail Harris, PA-C   BP 156/109 mmHg  Pulse 93  Temp(Src) 98.1 F (36.7 C) (Oral)  Resp 20  SpO2  97% Physical Exam  Constitutional: He appears well-developed and well-nourished. No distress.  HENT:  Head: Normocephalic.  Neck: Normal range of motion. Neck supple.  Cardiovascular: Normal rate, regular rhythm and normal heart sounds.   Pulmonary/Chest: Effort normal and breath sounds normal. No respiratory distress. He has no wheezes. He has no rales.  Abdominal: Soft. There is no tenderness.  Skin: Skin is warm and dry.  Small erythematous papules to the right lower ribs and flank. Tender to the touch. Hyperpigmented areas of skin from prior herpes zoster in the same area. 4x4cm area of scabbed, erythematous, dry skin to the right dorsal wrist.   Nursing note and vitals reviewed.   ED Course  Procedures (including critical care time) Labs Review Labs Reviewed  CBC WITH DIFFERENTIAL/PLATELET - Abnormal; Notable for the following:    Hemoglobin 17.6 (*)    MCHC 36.4 (*)    Lymphs Abs 4.5 (*)    All other components within normal limits  I-STAT CHEM 8, ED - Abnormal; Notable for the following:    Potassium 3.4 (*)    Chloride 96 (*)    Glucose, Bld 169 (*)    Calcium, Ion 1.12 (*)    Hemoglobin 17.7 (*)    All other components within normal limits  RAPID HIV SCREEN (HIV 1/2 AB+AG)    Imaging Review No results found. I have personally reviewed and evaluated these images and lab results as part of my medical decision-making.   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 This is his second episode in 2 months. I obtained a CBC and HIV, to make sure patient is not immunocompromised. Both normal. Patient's hemoglobin was found to be slightly elevated, as well as his glucose. Calcium is slightly low. This was discussed with patient. He will follow with primary care doctor for recheck. Will start on a acyclovir and prednisone. norco for pain. Follow up as needed.   Filed Vitals:   10/22/15 0608  10/22/15 0814  BP: 156/109 131/104  Pulse: 93 90  Temp: 98.1 F (36.7 C)   TempSrc: Oral   Resp: 20 18  SpO2: 97% 98%     Jaynie Crumbleatyana Harrie Cazarez, PA-C 10/22/15 0827  Gilda Creasehristopher J Pollina, MD 10/23/15 2308

## 2016-07-22 ENCOUNTER — Emergency Department (HOSPITAL_COMMUNITY)
Admission: EM | Admit: 2016-07-22 | Discharge: 2016-07-23 | Disposition: A | Discharge status: Home / Self Care | Payer: Self-pay | Attending: Emergency Medicine | Admitting: Emergency Medicine

## 2016-07-22 ENCOUNTER — Encounter (HOSPITAL_COMMUNITY): Payer: Self-pay | Admitting: Oncology

## 2016-07-22 DIAGNOSIS — Z7984 Long term (current) use of oral hypoglycemic drugs: Secondary | ICD-10-CM | POA: Insufficient documentation

## 2016-07-22 DIAGNOSIS — R739 Hyperglycemia, unspecified: Secondary | ICD-10-CM | POA: Insufficient documentation

## 2016-07-22 DIAGNOSIS — I10 Essential (primary) hypertension: Secondary | ICD-10-CM | POA: Insufficient documentation

## 2016-07-22 LAB — CBC WITH DIFFERENTIAL/PLATELET
Basophils Absolute: 0 10*3/uL (ref 0.0–0.1)
Basophils Relative: 0 %
EOS ABS: 0.1 10*3/uL (ref 0.0–0.7)
EOS PCT: 1 %
HCT: 47.1 % (ref 39.0–52.0)
Hemoglobin: 17.2 g/dL — ABNORMAL HIGH (ref 13.0–17.0)
LYMPHS ABS: 3.6 10*3/uL (ref 0.7–4.0)
Lymphocytes Relative: 46 %
MCH: 30 pg (ref 26.0–34.0)
MCHC: 36.5 g/dL — AB (ref 30.0–36.0)
MCV: 82.1 fL (ref 78.0–100.0)
MONOS PCT: 6 %
Monocytes Absolute: 0.4 10*3/uL (ref 0.1–1.0)
Neutro Abs: 3.8 10*3/uL (ref 1.7–7.7)
Neutrophils Relative %: 47 %
PLATELETS: 198 10*3/uL (ref 150–400)
RBC: 5.74 MIL/uL (ref 4.22–5.81)
RDW: 12.4 % (ref 11.5–15.5)
WBC: 7.9 10*3/uL (ref 4.0–10.5)

## 2016-07-22 LAB — URINALYSIS, ROUTINE W REFLEX MICROSCOPIC
Bacteria, UA: NONE SEEN
Bilirubin Urine: NEGATIVE
Glucose, UA: >=500 mg/dL — AB
Hgb urine dipstick: NEGATIVE
Ketones, ur: NEGATIVE mg/dL
Leukocytes, UA: NEGATIVE
Nitrite: NEGATIVE
PROTEIN: NEGATIVE mg/dL
SPECIFIC GRAVITY, URINE: 1.027 (ref 1.005–1.030)
SQUAMOUS EPITHELIAL / LPF: NONE SEEN
pH: 6 (ref 5.0–8.0)

## 2016-07-22 LAB — BASIC METABOLIC PANEL
Anion gap: 10 (ref 5–15)
BUN: 16 mg/dL (ref 6–20)
CHLORIDE: 94 mmol/L — AB (ref 101–111)
CO2: 26 mmol/L (ref 22–32)
Calcium: 9.3 mg/dL (ref 8.9–10.3)
Creatinine, Ser: 1.07 mg/dL (ref 0.61–1.24)
GFR calc Af Amer: >60 mL/min (ref >60)
GLUCOSE: 516 mg/dL — AB (ref 65–99)
POTASSIUM: 3.9 mmol/L (ref 3.5–5.1)
SODIUM: 130 mmol/L — AB (ref 135–145)

## 2016-07-22 LAB — CBG MONITORING, ED: Glucose-Capillary: 523 mg/dL (ref 65–99)

## 2016-07-22 MED ORDER — SODIUM CHLORIDE 0.9 % IV BOLUS (SEPSIS)
1000.0000 mL | Freq: Once | INTRAVENOUS | Status: AC
  Administered 2016-07-22: 1000 mL via INTRAVENOUS

## 2016-07-22 MED ORDER — INSULIN ASPART 100 UNIT/ML ~~LOC~~ SOLN
8.0000 [IU] | Freq: Once | SUBCUTANEOUS | Status: AC
  Administered 2016-07-22: 8 [IU] via SUBCUTANEOUS
  Filled 2016-07-22: qty 1

## 2016-07-22 MED ORDER — SODIUM CHLORIDE 0.9 % IV SOLN
INTRAVENOUS | Status: DC
  Administered 2016-07-22: 23:00:00 via INTRAVENOUS

## 2016-07-22 NOTE — ED Triage Notes (Signed)
Pt states that for the past four days he has been experiencing dry mouth, polyuria and excessive thirst.  Has family hx of diabetes.  States he has lost 20 lbs in that time.

## 2016-07-22 NOTE — ED Provider Notes (Signed)
[facility]-EMERGENCY DEPT Provider Note   CSN: [id number] Arrival date & time: 07/22/16  2112  By signing my name below, I, Andrew Wang, attest that this documentation has been prepared under the direction and in the presence of Andrew Hart, PA-C. Electronically Signed: Linna Wang, Scribe. 07/22/2016. 10:14 PM.  History   Chief Complaint Chief Complaint  Patient presents with  . Wants to Know if he has Diabetes    The history is provided by the patient. No language interpreter was used.     HPI Comments: Andrew Wang is a 61 y.o. male who presents to the Emergency Department with concern for diabetes. He reports excessive thirst, dry mouth, urinary frequency, and polyuria for the last 4 days. He states he has been eating normally. Pt notes a significant FMHx of diabetes, including his mother and both of his siblings. He denies recent illnesses, extremity pain, abdominal pain, nausea, vomiting, fever, chills or any other associated symptoms. No PCP.  Past Medical History:  Diagnosis Date  . Hypertension   . Shingles     Patient Active Problem List   Diagnosis Date Noted  . Shingles     History reviewed. No pertinent surgical history.     Home Medications    Prior to Admission medications   Medication Sig Start Date End Date Taking? Authorizing Provider  acyclovir (ZOVIRAX) 800 MG tablet Take 1 tablet (800 mg total) by mouth 5 (five) times daily. For 10 days. 08/07/15   Arthor Captain, PA-C  acyclovir (ZOVIRAX) 800 MG tablet Take 1 tablet (800 mg total) by mouth 5 (five) times daily. 10/22/15   Tatyana Kirichenko, PA-C  atenolol (TENORMIN) 50 MG tablet Take 1 tablet (50 mg total) by mouth daily. 06/17/12   Clanford Cyndie Mull, MD  cyclobenzaprine (FLEXERIL) 10 MG tablet Take 1 tablet (10 mg total) by mouth 3 (three) times daily as needed for muscle spasms. 08/25/14   Mercedes Street, PA-C  HYDROcodone-acetaminophen (NORCO) 5-325 MG tablet Take 1 tablet by mouth every 6 (six) hours  as needed for moderate pain. 10/22/15   Tatyana Kirichenko, PA-C  ibuprofen (ADVIL,MOTRIN) 800 MG tablet Take 1 tablet (800 mg total) by mouth 3 (three) times daily. 08/29/15   Mady Gemma, PA-C  metFORMIN (GLUCOPHAGE) 500 MG tablet Take 1 tablet (500 mg total) by mouth 2 (two) times daily with a meal. 07/23/16   Bethel Born, PA-C  oxyCODONE-acetaminophen (PERCOCET/ROXICET) 5-325 MG tablet Take 2 tablets by mouth every 4 (four) hours as needed for severe pain. 08/29/15   Mady Gemma, PA-C  pravastatin (PRAVACHOL) 20 MG tablet Take 1 tablet (20 mg total) by mouth daily. 06/17/12   Clanford Cyndie Mull, MD  predniSONE (DELTASONE) 20 MG tablet Take 2 tablets (40 mg total) by mouth daily. 10/22/15   Tatyana Kirichenko, PA-C  traZODone (DESYREL) 100 MG tablet Take 1 tablet (100 mg total) by mouth at bedtime. Patient taking differently: Take 100 mg by mouth at bedtime as needed for sleep.  06/17/12   Clanford Cyndie Mull, MD  valACYclovir (VALTREX) 1000 MG tablet Take 1 tablet (1,000 mg total) by mouth 3 (three) times daily. 08/07/15   Arthor Captain, PA-C    Family History No family history on file.  Social History Social History  Substance Use Topics  . Smoking status: Never Smoker  . Smokeless tobacco: Never Used  . Alcohol use No     Allergies   Penicillins   Review of Systems Review of Systems  Constitutional: Negative for chills  and fever.  Gastrointestinal: Negative for abdominal pain, nausea and vomiting.  Endocrine: Positive for polyuria.  Genitourinary: Positive for frequency.  Musculoskeletal: Negative for arthralgias and myalgias.    Physical Exam Updated Vital Signs BP 159/89 (BP Location: Left Arm)   Pulse 106   Temp 98.4 F (36.9 C) (Oral)   Resp 18   Ht 6\' 3"  (1.905 m)   Wt 240 lb 3.2 oz (109 kg)   SpO2 99%   BMI 30.02 kg/m   Physical Exam  Constitutional: He is oriented to person, place, and time. He appears well-developed and well-nourished. No  distress.  Pleasant, NAD  HENT:  Head: Normocephalic and atraumatic.  Eyes: Conjunctivae and EOM are normal.  Neck: Neck supple. No tracheal deviation present.  Cardiovascular: Normal rate and regular rhythm.  Exam reveals no gallop and no friction rub.   No murmur heard. Pulmonary/Chest: Effort normal and breath sounds normal. No respiratory distress. He has no wheezes. He has no rales. He exhibits no tenderness.  Abdominal: Soft. Bowel sounds are normal. He exhibits no distension and no mass. There is no tenderness. There is no rebound and no guarding. No hernia.  Musculoskeletal: Normal range of motion.  Neurological: He is alert and oriented to person, place, and time.  Skin: Skin is warm and dry.  Psychiatric: He has a normal mood and affect. His behavior is normal.  Nursing note and vitals reviewed.    ED Treatments / Results  Labs (all labs ordered are listed, but only abnormal results are displayed) Labs Reviewed  CBC WITH DIFFERENTIAL/PLATELET - Abnormal; Notable for the following:       Result Value   Hemoglobin 17.2 (*)    MCHC 36.5 (*)    All other components within normal limits  BASIC METABOLIC PANEL - Abnormal; Notable for the following:    Sodium 130 (*)    Chloride 94 (*)    Glucose, Bld 516 (*)    All other components within normal limits  URINALYSIS, ROUTINE W REFLEX MICROSCOPIC - Abnormal; Notable for the following:    Color, Urine STRAW (*)    Glucose, UA >=500 (*)    All other components within normal limits  CBG MONITORING, ED - Abnormal; Notable for the following:    Glucose-Capillary 523 (*)    All other components within normal limits  CBG MONITORING, ED - Abnormal; Notable for the following:    Glucose-Capillary 363 (*)    All other components within normal limits  CBG MONITORING, ED - Abnormal; Notable for the following:    Glucose-Capillary 299 (*)    All other components within normal limits  CBG MONITORING, ED - Abnormal; Notable for the  following:    Glucose-Capillary 236 (*)    All other components within normal limits    EKG  EKG Interpretation None       Radiology No results found.  Procedures Procedures (including critical care time)  DIAGNOSTIC STUDIES: Oxygen Saturation is 99% on RA, normal by my interpretation.    COORDINATION OF CARE: 10:20 PM Discussed treatment plan with pt at bedside and pt agreed to plan.  Medications Ordered in ED Medications  0.9 %  sodium chloride infusion ( Intravenous Stopped 07/23/16 0139)  sodium chloride 0.9 % bolus 1,000 mL (0 mLs Intravenous Stopped 07/22/16 2301)  insulin aspart (novoLOG) injection 8 Units (8 Units Subcutaneous Given 07/22/16 2335)  sodium chloride 0.9 % bolus 1,000 mL (0 mLs Intravenous Stopped 07/23/16 0029)     Initial  Impression / Assessment and Plan / ED Course  I have reviewed the triage vital signs and the nursing notes.  Pertinent labs & imaging results that were available during my care of the patient were reviewed by me and considered in my medical decision making (see chart for details).  61 year old male with extensive family hx of DM presents with hyperglycemia and newly diagnosed Type 2 DM. He is initially mildly tachycardic which has improved with fluids. Also mildly hypertensive but otherwise vitals are normal. Initial CBG is 523. CBC unremarkable. BMP remarkable for mild hyponatremia and mild hypochloremia. Anion gap is normal. UA remarkable for >500 glucose in urine. No ketones.  2L IVF and 8 units insulin given. CBG has come down to 236. Will d/c with 30 day supply of Metformin and [facility] given. Patient is NAD, non-toxic, with stable VS. Patient is informed of clinical course, understands medical decision making process, and agrees with plan. Opportunity for questions provided and all questions answered. Return precautions given.   Final Clinical Impressions(s) / ED Diagnoses   Final diagnoses:    Hyperglycemia    New Prescriptions Discharge Medication List as of 07/23/2016  1:18 AM    START taking these medications   Details  metFORMIN (GLUCOPHAGE) 500 MG tablet Take 1 tablet (500 mg total) by mouth 2 (two) times daily with a meal., Starting Sun 07/23/2016, Print       I personally performed the services described in this documentation, which was scribed in my presence. The recorded information has been reviewed and is accurate.    Bethel BornKelly Marie Carolyne Whitsel, PA-C 07/23/16 0416    Nira ConnPedro Eduardo Cardama, MD 07/23/16 [phone]

## 2016-07-23 LAB — CBG MONITORING, ED
Glucose-Capillary: 236 mg/dL — ABNORMAL HIGH (ref 65–99)
Glucose-Capillary: 299 mg/dL — ABNORMAL HIGH (ref 65–99)
Glucose-Capillary: 363 mg/dL — ABNORMAL HIGH (ref 65–99)

## 2016-07-23 MED ORDER — METFORMIN HCL 500 MG PO TABS: 500.0000 mg | ORAL_TABLET | Freq: Two times a day (BID) | ORAL | 0 refills | Status: DC

## 2016-07-23 NOTE — Discharge Instructions (Signed)
Please follow up with [facility] to follow up regarding diabetes Return for worsening symptoms

## 2016-07-27 ENCOUNTER — Ambulatory Visit: Payer: Self-pay | Attending: Internal Medicine | Admitting: Physician Assistant

## 2016-07-27 VITALS — BP 133/90 | HR 89 | Temp 98.4°F | Resp 16 | Wt 239.0 lb

## 2016-07-27 DIAGNOSIS — Z7984 Long term (current) use of oral hypoglycemic drugs: Secondary | ICD-10-CM | POA: Insufficient documentation

## 2016-07-27 DIAGNOSIS — E785 Hyperlipidemia, unspecified: Secondary | ICD-10-CM | POA: Insufficient documentation

## 2016-07-27 DIAGNOSIS — Z880 Allergy status to penicillin: Secondary | ICD-10-CM | POA: Insufficient documentation

## 2016-07-27 DIAGNOSIS — E119 Type 2 diabetes mellitus without complications: Secondary | ICD-10-CM | POA: Insufficient documentation

## 2016-07-27 DIAGNOSIS — I10 Essential (primary) hypertension: Secondary | ICD-10-CM | POA: Insufficient documentation

## 2016-07-27 DIAGNOSIS — Z833 Family history of diabetes mellitus: Secondary | ICD-10-CM | POA: Insufficient documentation

## 2016-07-27 DIAGNOSIS — Z79899 Other long term (current) drug therapy: Secondary | ICD-10-CM | POA: Insufficient documentation

## 2016-07-27 LAB — POCT GLYCOSYLATED HEMOGLOBIN (HGB A1C): HEMOGLOBIN A1C: 14.2

## 2016-07-27 MED ORDER — GLUCOSE BLOOD VI STRP: ORAL_STRIP | 12 refills | Status: DC

## 2016-07-27 MED ORDER — METFORMIN HCL 500 MG PO TABS: 500.0000 mg | ORAL_TABLET | Freq: Two times a day (BID) | ORAL | 1 refills | Status: DC

## 2016-07-27 MED ORDER — TRUE METRIX METER DEVI: 1.0000 | Freq: Four times a day (QID) | 0 refills | Status: DC

## 2016-07-27 MED ORDER — TRUEPLUS LANCETS 28G MISC: 1.0000 | Freq: Four times a day (QID) | 1 refills | Status: DC

## 2016-07-27 MED ORDER — LISINOPRIL 10 MG PO TABS: 10.0000 mg | ORAL_TABLET | Freq: Every day | ORAL | 1 refills | Status: DC

## 2016-07-27 MED ORDER — PRAVASTATIN SODIUM 20 MG PO TABS: 20.0000 mg | ORAL_TABLET | Freq: Every day | ORAL | 1 refills | Status: DC

## 2016-07-27 MED FILL — ?LISINOPRIL 10 MG TABLET: 10 | 30 days supply | Qty: 30 | Fill #0

## 2016-07-27 MED FILL — TRUE METRIX TEST STRIP: 25 days supply | Qty: 100 | Fill #0

## 2016-07-27 MED FILL — TRUE METRIX BLOOD GLUCOSE M: W/DEVICE | 1 days supply | Qty: 1 | Fill #0

## 2016-07-27 MED FILL — TRUEplus LANCETS 28G MISC: 25 days supply | Qty: 100 | Fill #0

## 2016-07-27 MED FILL — ?METFORMIN HCL 500MG TABLET: 500 | 30 days supply | Qty: 60 | Fill #0

## 2016-07-27 MED FILL — PRAVASTATIN NA 20 MG TAB: 20 | 30 days supply | Qty: 30 | Fill #0

## 2016-07-27 NOTE — Progress Notes (Signed)
Andrew Wang  JGO:[id number]  TDH:[id number]  DOB - 1956/04/03  Chief Complaint  Patient presents with  . Follow-up  . Diabetes  . Hypertension       Subjective:   Andrew Wang is a 61 y.o. male here today for establishment of care. He has a past medical history of hypertension and previously was controlled with 2 medications. He lost his insurance recently and has not taken medicines for at least 6 months. He went to the emergency department on 07/22/2016 with suspicions of possible diabetes. He has strong family history of diabetes. He was having issues with frequent thirst, dry mouth, frequent urination and frequent eating. His symptoms had only been occurring for 4-5 days he said.  In the ED his blood pressure was 159/89. Pulse 106. His examination was pretty normal. His glucose was 516. Is an ion gap was normal. His sodium was 1:30. He was started on IV fluids and given insulin. His blood sugar came down to 236 at release. He was started on metformin 500 mg twice daily.  He has been compliant with the medication. He does not have a meter at home so has not been keeping a log. He is requesting assistance with insurance. No chest pain. His thirst has improved. His appetite has changed. He is less fatigued. He's urinating less frequently. Overall he feels much better.   ROS: GEN: denies fever or chills, denies change in weight Skin: denies lesions or rashes HEENT: denies headache, earache, epistaxis, sore throat, or neck pain LUNGS: denies SHOB, dyspnea, PND, orthopnea CV: denies CP or palpitations ABD: denies abd pain, N or V EXT: denies muscle spasms or swelling; no pain in lower ext, no weakness NEURO: denies numbness or tingling, denies sz, stroke or TIA  Problem  Shingles (Resolved)    ALLERGIES: Allergies  Allergen Reactions  . Penicillins Other (See Comments)    Childhood allergy        PAST MEDICAL HISTORY: Past Medical History:  Diagnosis Date  .  Hypertension   . Shingles     PAST SURGICAL HISTORY: No past surgical history on file.  MEDICATIONS AT HOME: Prior to Admission medications   Medication Sig Start Date End Date Taking? Authorizing Provider  metFORMIN (GLUCOPHAGE) 500 MG tablet Take 1 tablet (500 mg total) by mouth 2 (two) times daily with a meal. 07/27/16  Yes Brithney Bensen S Kaiya Boatman, PA-C  acyclovir (ZOVIRAX) 800 MG tablet Take 1 tablet (800 mg total) by mouth 5 (five) times daily. For 10 days. Patient not taking: Reported on 07/27/2016 08/07/15   Margarita Mail, PA-C  acyclovir (ZOVIRAX) 800 MG tablet Take 1 tablet (800 mg total) by mouth 5 (five) times daily. Patient not taking: Reported on 07/27/2016 10/22/15   Tatyana Kirichenko, PA-C  atenolol (TENORMIN) 50 MG tablet Take 1 tablet (50 mg total) by mouth daily. Patient not taking: Reported on 07/27/2016 06/17/12   Clanford Marisa Hua, MD  Blood Glucose Monitoring Suppl (TRUE METRIX METER) DEVI 1 kit by Does not apply route 4 (four) times daily. 07/27/16   Brayton Caves, PA-C  cyclobenzaprine (FLEXERIL) 10 MG tablet Take 1 tablet (10 mg total) by mouth 3 (three) times daily as needed for muscle spasms. Patient not taking: Reported on 07/27/2016 08/25/14   [place], PA-C  glucose blood (TRUE METRIX BLOOD GLUCOSE TEST) test strip Use as instructed 07/27/16   Brayton Caves, PA-C  HYDROcodone-acetaminophen (NORCO) 5-325 MG tablet Take 1 tablet by mouth every 6 (six) hours as needed  for moderate pain. Patient not taking: Reported on 07/27/2016 10/22/15   Jeannett Senior, PA-C  ibuprofen (ADVIL,MOTRIN) 800 MG tablet Take 1 tablet (800 mg total) by mouth 3 (three) times daily. Patient not taking: Reported on 07/27/2016 08/29/15   Marella Chimes, PA-C  lisinopril (PRINIVIL,ZESTRIL) 10 MG tablet Take 1 tablet (10 mg total) by mouth daily. 07/27/16   Brayton Caves, PA-C  oxyCODONE-acetaminophen (PERCOCET/ROXICET) 5-325 MG tablet Take 2 tablets by mouth every 4 (four) hours as needed for  severe pain. Patient not taking: Reported on 07/27/2016 08/29/15   Marella Chimes, PA-C  pravastatin (PRAVACHOL) 20 MG tablet Take 1 tablet (20 mg total) by mouth daily. 07/27/16   Sneijder Bernards Daneil Dan, PA-C  predniSONE (DELTASONE) 20 MG tablet Take 2 tablets (40 mg total) by mouth daily. Patient not taking: Reported on 07/27/2016 10/22/15   Jeannett Senior, PA-C  traZODone (DESYREL) 100 MG tablet Take 1 tablet (100 mg total) by mouth at bedtime. Patient taking differently: Take 100 mg by mouth at bedtime as needed for sleep.  06/17/12   Clanford Marisa Hua, MD  TRUEPLUS LANCETS 28G MISC 1 Stick by Does not apply route 4 (four) times daily. 07/27/16   Brayton Caves, PA-C  valACYclovir (VALTREX) 1000 MG tablet Take 1 tablet (1,000 mg total) by mouth 3 (three) times daily. Patient not taking: Reported on 07/27/2016 08/07/15   Margarita Mail, PA-C     Objective:   Vitals:   07/27/16 1349  BP: 133/90  Pulse: 89  Resp: 16  Temp: 98.4 F (36.9 C)  TempSrc: Oral  SpO2: 95%  Weight: 239 lb (108.4 kg)    Exam General appearance : Awake, alert, not in any distress. Speech Clear. Not toxic looking HEENT: Atraumatic and Normocephalic, pupils equally reactive to light and accomodation Neck: supple, no JVD. No cervical lymphadenopathy.  Chest:Good air entry bilaterally, no added sounds  CVS: S1 S2 regular, no murmurs.  Abdomen: Bowel sounds present, Non tender and not distended with no gaurding, rigidity or rebound. Extremities: B/L Lower Ext shows no edema, both legs are warm to touch Neurology: Awake alert, and oriented X 3, CN II-XII intact, Non focal Skin:No Rash Wounds:N/A  Data Review Lab Results  Component Value Date   HGBA1C 14.2 07/27/2016     Assessment & Plan  1. New DM2  -Metformin 500 mg BID  -meter, lancets, strips etc  -Keep a log and bring in 1-2 weeks to decide about med titration Aim for 30 minutes of exercise most days. Rethink what you drink. Water is great! Aim  for 2-3 Carb Choices per meal (30-45 grams) +/- 1 either way  Aim for 0-15 Carbs per snack if hungry  Include protein in moderation with your meals and snacks  Consider reading food labels for Total Carbohydrate and Fat Grams of foods  Consider checking BG at alternate times per day  Continue taking medication as directed Be mindful about how much sugar you are adding to beverages and other foods. Fruit Punch - find one with no sugar  Measure and decrease portions of carbohydrate foods  Make your plate and don't go back for seconds  2. HTN  -Add ACE   -recheck at f/u 3. HL  -add statin    Attempting to modify all RFs.  Return in about 2 weeks (around 08/10/2016).  Development worker, community.  The patient was given clear instructions to go to ER or return to medical center if symptoms don't improve, worsen or new problems  develop. The patient verbalized understanding. The patient was told to call to get lab results if they haven't heard anything in the next week.   This note has been created with Surveyor, quantity. Any transcriptional errors are unintentional.    Zettie Pho, PA-C [facility] [place], [place]   07/27/2016, 3:00 PM

## 2016-07-27 NOTE — Patient Instructions (Signed)
Aim for 30 minutes of exercise most days. Rethink what you drink. Water is great! Aim for 2-3 Carb Choices per meal (30-45 grams) +/- 1 either way  Aim for 0-15 Carbs per snack if hungry  Include protein in moderation with your meals and snacks  Consider reading food labels for Total Carbohydrate and Fat Grams of foods  Consider checking BG at alternate times per day  Continue taking medication as directed Be mindful about how much sugar you are adding to beverages and other foods. Fruit Punch - find one with no sugar  Measure and decrease portions of carbohydrate foods  Make your plate and don't go back for seconds   We will start with one BP pill and change as needed. Your numbers are not too bad today.

## 2016-08-10 ENCOUNTER — Ambulatory Visit: Payer: Self-pay | Attending: Family Medicine | Admitting: Family Medicine

## 2016-08-10 ENCOUNTER — Encounter: Payer: Self-pay | Admitting: Family Medicine

## 2016-08-10 VITALS — BP 133/81 | HR 71 | Temp 98.3°F | Resp 18 | Ht 75.0 in | Wt 243.4 lb

## 2016-08-10 DIAGNOSIS — B354 Tinea corporis: Secondary | ICD-10-CM | POA: Insufficient documentation

## 2016-08-10 DIAGNOSIS — Z7984 Long term (current) use of oral hypoglycemic drugs: Secondary | ICD-10-CM | POA: Insufficient documentation

## 2016-08-10 DIAGNOSIS — I10 Essential (primary) hypertension: Secondary | ICD-10-CM | POA: Insufficient documentation

## 2016-08-10 DIAGNOSIS — E119 Type 2 diabetes mellitus without complications: Secondary | ICD-10-CM | POA: Insufficient documentation

## 2016-08-10 DIAGNOSIS — Z79899 Other long term (current) drug therapy: Secondary | ICD-10-CM | POA: Insufficient documentation

## 2016-08-10 LAB — GLUCOSE, POCT (MANUAL RESULT ENTRY): POC Glucose: 87 mg/dl (ref 70–99)

## 2016-08-10 MED ORDER — TERBINAFINE HCL 250 MG PO TABS: 250.0000 mg | ORAL_TABLET | Freq: Every day | ORAL | 0 refills | Status: DC

## 2016-08-10 MED ORDER — METFORMIN HCL ER 500 MG PO TB24: 500.0000 mg | ORAL_TABLET | Freq: Every day | ORAL | 2 refills | Status: DC

## 2016-08-10 MED ORDER — TERBINAFINE HCL 1 % EX CREA: 1.0000 "application " | TOPICAL_CREAM | Freq: Two times a day (BID) | CUTANEOUS | 0 refills | Status: DC

## 2016-08-10 MED ORDER — LISINOPRIL 20 MG PO TABS: 20.0000 mg | ORAL_TABLET | Freq: Every day | ORAL | 3 refills | Status: DC

## 2016-08-10 NOTE — Progress Notes (Signed)
Subjective:  Patient ID: Andrew Wang, male    DOB: [birth year]  Age: 61 y.o. MRN: [id number]  CC: Hypertension and Diabetes   HPI Andrew Wang presents for   DM: Reports not taking diabetic medications as prescribed. Reports Metformin 250 mg BID instead.He brings a log of his CBG to office visit. CBG's ranging 150's to 200's on average.   HTN: Reports adherence with medications. Denies any CP, SOB, or swelling of the BLE.   Skin rash: About 1 month history of pruritic rash to the posterior left thigh and to the left lateral forearm. Reports taking OTC hydrocortisone cream without relief of symptoms.   Outpatient Medications Prior to Visit  Medication Sig Dispense Refill  . acyclovir (ZOVIRAX) 800 MG tablet Take 1 tablet (800 mg total) by mouth 5 (five) times daily. For 10 days. (Patient not taking: Reported on 07/27/2016) 60 tablet 0  . acyclovir (ZOVIRAX) 800 MG tablet Take 1 tablet (800 mg total) by mouth 5 (five) times daily. (Patient not taking: Reported on 07/27/2016) 35 tablet 0  . atenolol (TENORMIN) 50 MG tablet Take 1 tablet (50 mg total) by mouth daily. (Patient not taking: Reported on 07/27/2016) 30 tablet 3  . Blood Glucose Monitoring Suppl (TRUE METRIX METER) DEVI 1 kit by Does not apply route 4 (four) times daily. 1 Device 0  . cyclobenzaprine (FLEXERIL) 10 MG tablet Take 1 tablet (10 mg total) by mouth 3 (three) times daily as needed for muscle spasms. (Patient not taking: Reported on 07/27/2016) 15 tablet 0  . glucose blood (TRUE METRIX BLOOD GLUCOSE TEST) test strip Use as instructed 100 each 12  . HYDROcodone-acetaminophen (NORCO) 5-325 MG tablet Take 1 tablet by mouth every 6 (six) hours as needed for moderate pain. (Patient not taking: Reported on 07/27/2016) 15 tablet 0  . ibuprofen (ADVIL,MOTRIN) 800 MG tablet Take 1 tablet (800 mg total) by mouth 3 (three) times daily. (Patient not taking: Reported on 07/27/2016) 21 tablet 0  . oxyCODONE-acetaminophen (PERCOCET/ROXICET)  5-325 MG tablet Take 2 tablets by mouth every 4 (four) hours as needed for severe pain. (Patient not taking: Reported on 07/27/2016) 12 tablet 0  . pravastatin (PRAVACHOL) 20 MG tablet Take 1 tablet (20 mg total) by mouth daily. 30 tablet 1  . predniSONE (DELTASONE) 20 MG tablet Take 2 tablets (40 mg total) by mouth daily. (Patient not taking: Reported on 07/27/2016) 10 tablet 0  . traZODone (DESYREL) 100 MG tablet Take 1 tablet (100 mg total) by mouth at bedtime. (Patient taking differently: Take 100 mg by mouth at bedtime as needed for sleep. ) 30 tablet 3  . TRUEPLUS LANCETS 28G MISC 1 Stick by Does not apply route 4 (four) times daily. 120 each 1  . valACYclovir (VALTREX) 1000 MG tablet Take 1 tablet (1,000 mg total) by mouth 3 (three) times daily. (Patient not taking: Reported on 07/27/2016) 30 tablet 0  . lisinopril (PRINIVIL,ZESTRIL) 10 MG tablet Take 1 tablet (10 mg total) by mouth daily. 30 tablet 1  . metFORMIN (GLUCOPHAGE) 500 MG tablet Take 1 tablet (500 mg total) by mouth 2 (two) times daily with a meal. 60 tablet 1   No facility-administered medications prior to visit.     ROS Review of Systems  Constitutional: Negative.   Eyes: Negative.   Respiratory: Negative.   Cardiovascular: Negative.   Gastrointestinal: Negative.   Skin: Positive for rash.    Objective:  BP 133/81 (BP Location: Left Arm, Patient Position: Sitting, Cuff Size: Normal)  Pulse 71   Temp 98.3 F (36.8 C) (Oral)   Resp 18   Ht 6' 3"  (1.905 m)   Wt 243 lb 6.4 oz (110.4 kg)   SpO2 97%   BMI 30.42 kg/m   BP/Weight 08/10/2016 07/27/2016 12/15/4578  Systolic BP 998 338 250  Diastolic BP 81 90 89  Wt. (Lbs) 243.4 239 -  BMI 30.42 29.87 -      Physical Exam  Constitutional: He appears well-developed and well-nourished.  Eyes: Pupils are equal, round, and reactive to light.  Neck: Normal range of motion. Neck supple. No JVD present.  Cardiovascular: Normal rate, regular rhythm, normal heart sounds and  intact distal pulses.   Pulmonary/Chest: Effort normal and breath sounds normal.  Abdominal: Soft. Bowel sounds are normal.  Skin: Skin is warm and dry.  Psychiatric: He is agitated. He expresses impulsivity.  Nursing note and vitals reviewed.    Assessment & Plan:   Problem List Items Addressed This Visit    None    Visit Diagnoses    Type 2 diabetes mellitus without complication, without long-term current use of insulin ([place])    -  Primary   -Recommend patient take metformin as prescribed for better glucose control.   Relevant Medications   lisinopril (PRINIVIL,ZESTRIL) 20 MG tablet   metFORMIN (GLUCOPHAGE-XR) 500 MG 24 hr tablet   Other Relevant Orders   Glucose (CBG) (Completed)   Ambulatory referral to Podiatry   Ambulatory referral to Ophthalmology   Essential hypertension       Relevant Medications   lisinopril (PRINIVIL,ZESTRIL) 20 MG tablet   Tinea corporis       Relevant Medications   terbinafine (LAMISIL AT) 1 % cream   terbinafine (LAMISIL) 250 MG tablet      Meds ordered this encounter  Medications  . lisinopril (PRINIVIL,ZESTRIL) 20 MG tablet    Sig: Take 1 tablet (20 mg total) by mouth daily.    Dispense:  90 tablet    Refill:  3    Order Specific Question:   Supervising Provider    Answer:   Tresa Garter [id number]  . metFORMIN (GLUCOPHAGE-XR) 500 MG 24 hr tablet    Sig: Take 1 tablet (500 mg total) by mouth daily with breakfast.    Dispense:  30 tablet    Refill:  2    Order Specific Question:   Supervising Provider    Answer:   Tresa Garter [id number]  . terbinafine (LAMISIL AT) 1 % cream    Sig: Apply 1 application topically 2 (two) times daily. To affected areas.    Dispense:  30 g    Refill:  0    Order Specific Question:   Supervising Provider    Answer:   Tresa Garter [id number]  . terbinafine (LAMISIL) 250 MG tablet    Sig: Take 1 tablet (250 mg total) by mouth daily.    Dispense:  14 tablet    Refill:  0    Order  Specific Question:   Supervising Provider    Answer:   Tresa Garter [id number]    Follow-up: Return in about 2 weeks (around 08/24/2016) for Diabetes and BP with clinical pharmacist.   Alfonse Spruce FNP

## 2016-08-10 NOTE — Patient Instructions (Addendum)
Apply for orange card to complete referral process. Follow up in 2 weeks with clinical pharmacist for Diabetes & HTN. Schedule a follow up for Diabetes & HTN in 3 months.  Body Ringworm Body ringworm is an infection of the skin that often causes a ring-shaped rash. Body ringworm can affect any part of your skin. It can spread easily to others. Body ringworm is also called tinea corporis. What are the causes? This condition is caused by funguses called dermatophytes. The condition develops when these funguses grow out of control on the skin. You can get this condition if you touch a person or animal that has it. You can also get it if you share clothing, bedding, towels, or any other object with an infected person or pet. What increases the risk? This condition is more likely to develop in:  Athletes who often make skin-to-skin contact with other athletes, such as wrestlers.  People who share equipment and mats.  People with a weakened immune system. What are the signs or symptoms? Symptoms of this condition include:  Itchy, raised red spots and bumps.  Red scaly patches.  A ring-shaped rash. The rash may have:  A clear center.  Scales or red bumps at its center.  Redness near its borders.  Dry and scaly skin on or around it. How is this diagnosed? This condition can usually be diagnosed with a skin exam. A skin scraping may be taken from the affected area and examined under a microscope to see if the fungus is present. How is this treated? This condition may be treated with:  An antifungal cream or ointment.  An antifungal shampoo.  Antifungal medicines. These may be prescribed if your ringworm is severe, keeps coming back, or lasts a long time. Follow these instructions at home:  Take over-the-counter and prescription medicines only as told by your health care provider.  If you were given an antifungal cream or ointment:  Use it as told by your health care  provider.  Wash the infected area and dry it completely before applying the cream or ointment.  If you were given an antifungal shampoo:  Use it as told by your health care provider.  Leave the shampoo on your body for 3-5 minutes before rinsing.  While you have a rash:  Wear loose clothing to stop clothes from rubbing and irritating it.  Wash or change your bed sheets every night.  If your pet has the same infection, take your pet to see a Animal nutritionist. How is this prevented?  Practice good hygiene.  Wear sandals or shoes in public places and showers.  Do not share personal items with others.  Avoid touching red patches of skin on other people.  Avoid touching pets that have bald spots.  If you touch an animal that has a bald spot, wash your hands. Contact a health care provider if:  Your rash continues to spread after 7 days of treatment.  Your rash is not gone in 4 weeks.  The area around your rash gets red, warm, tender, and swollen. This information is not intended to replace advice given to you by your health care provider. Make sure you discuss any questions you have with your health care provider. Document Released: 05/19/2000 Document Revised: 10/28/2015 Document Reviewed: 03/18/2015 Elsevier Interactive Patient Education  2017 [employer].  Type 2 Diabetes Mellitus, Self Care, Adult Caring for yourself after you have been diagnosed with type 2 diabetes (type 2 diabetes mellitus) means keeping your blood sugar (  glucose) under control with a balance of:  Nutrition.  Exercise.  Lifestyle changes.  Medicines or insulin, if necessary.  Support from your team of health care providers and others. The following information explains what you need to know to manage your diabetes at home. What do I need to do to manage my blood glucose?  Check your blood glucose every day, as often as told by your health care provider.  Contact your health care provider if  your blood glucose is above your target for 2 tests in a row.  Have your A1c (hemoglobin A1c) level checked at least two times a year, or as often as told by your health care provider. Your health care provider will set individualized treatment goals for you. Generally, the goal of treatment is to maintain the following blood glucose levels:  Before meals (preprandial): 80-130 mg/dL (4.4-7.2 mmol/L).  After meals (postprandial): below 180 mg/dL (10 mmol/L).  A1c level: less than 7%. What do I need to know about hyperglycemia and hypoglycemia? What is hyperglycemia?  Hyperglycemia, also called high blood glucose, occurs when blood glucose is too high.Make sure you know the early signs of hyperglycemia, such as:  Increased thirst.  Hunger.  Feeling very tired.  Needing to urinate more often than usual.  Blurry vision. What is hypoglycemia?  Hypoglycemia, also called low blood glucose, occurswith a blood glucose level at or below 70 mg/dL (3.9 mmol/L). The risk for hypoglycemia increases during or after exercise, during sleep, during illness, and when skipping meals or not eating for a long time (fasting). It is important to know the symptoms of hypoglycemia and treat it right away. Always have a 15-gram rapid-acting carbohydrate snack with you to treat low blood glucose. Family members and close friends should also know the symptoms and should understand how to treat hypoglycemia, in case you are not able to treat yourself. What are the symptoms of hypoglycemia?  Hypoglycemia symptoms can include:  Hunger.  Anxiety.  Sweating and feeling clammy.  Confusion.  Dizziness or feeling light-headed.  Sleepiness.  Nausea.  Increased heart rate.  Headache.  Blurry vision.  Seizure.  Nightmares.  Tingling or numbness around the mouth, lips, or tongue.  A change in speech.  Decreased ability to concentrate.  A change in coordination.  Restless sleep.  Tremors or  shakes.  Fainting.  Irritability. How do I treat hypoglycemia?   If you are alert and able to swallow safely, follow the 15:15 rule:  Take 15 grams of a rapid-acting carbohydrate. Rapid-acting options include:  1 tube of glucose gel.  3 glucose pills.  6-8 pieces of hard candy.  4 oz (120 mL) of fruit juice.  4 oz (120 mL) of regular (not diet) soda.  Check your blood glucose 15 minutes after you take the carbohydrate.  If the repeat blood glucose level is still at or below 70 mg/dL (3.9 mmol/L), take 15 grams of a carbohydrate again.  If your blood glucose level does not increase above 70 mg/dL (3.9 mmol/L) after 3 tries, seek emergency medical care.  After your blood glucose level returns to normal, eat a meal or a snack within 1 hour. How do I treat severe hypoglycemia?  Severe hypoglycemia is when your blood glucose level is at or below 54 mg/dL (3 mmol/L). Severe hypoglycemia is an emergency. Do not wait to see if the symptoms will go away. Get medical help right away. Call your local emergency services (911 in the [place] Do not drive yourself  to the hospital. If you have severe hypoglycemia and you cannot eat or drink, you may need an injection of glucagon. A family member or close friend should learn how to check your blood glucose and how to give you a glucagon injection. Ask your health care provider if you need to have an emergency glucagon injection kit available. Severe hypoglycemia may need to be treated in a hospital. The treatment may include getting glucose through an IV tube. You may also need treatment for the cause of your hypoglycemia. Can having diabetes put me at risk for other conditions? Having diabetes can put you at risk for other long-term (chronic) conditions, such as heart disease and kidney disease. Your health care provider may prescribe medicines to help prevent complications from diabetes. These medicines may include:  Aspirin.  Medicine to lower  cholesterol.  Medicine to control blood pressure. What else can I do to manage my diabetes? Take your diabetes medicines as told   If your health care provider prescribed insulin or diabetes medicines, take them every day.  Do not run out of insulin or other diabetes medicines that you take. Plan ahead so you always have these available.  If you use insulin, adjust your dosage based on how physically active you are and what foods you eat. Your health care provider will tell you how to adjust your dosage. Make healthy food choices   The things that you eat and drink affect your blood glucose and your insulin dosage. Making good choices helps to control your diabetes and prevent other health problems. A healthy meal plan includes eating lean proteins, complex carbohydrates, fresh fruits and vegetables, low-fat dairy products, and healthy fats. Make an appointment to see a diet and nutrition specialist (registered dietitian) to help you create an eating plan that is right for you. Make sure that you:  Follow instructions from your health care provider about eating or drinking restrictions.  Drink enough fluid to keep your urine clear or pale yellow.  Eat healthy snacks between nutritious meals.  Track the carbohydrates that you eat. Do this by reading food labels and learning the standard serving sizes of foods.  Follow your sick day plan whenever you cannot eat or drink as usual. Make this plan in advance with your health care provider. Stay active   Exercise regularly, as told by your health care provider. This may include:  Stretching and doing strength exercises, such as yoga or weightlifting, at least 2 times a week.  Doing at least 150 minutes of moderate-intensity or vigorous-intensity exercise each week. This could be brisk walking, biking, or water aerobics.  Spread out your activity over at least 3 days of the week.  Do not go more than 2 days in a row without doing some  kind of physical activity. When you start a new exercise or activity, work with your health care provider to adjust your insulin, medicines, or food intake as needed. Make healthy lifestyle choices   Do not use any tobacco products, such as cigarettes, chewing tobacco, and e-cigarettes. If you need help quitting, ask your health care provider.  If your health care provider says that alcohol is safe for you, limit alcohol intake to no more than 1 drink per day for nonpregnant women and 2 drinks per day for men. One drink equals 12 oz of beer, 5 oz of wine, or 1 oz of hard liquor.  Learn to manage stress. If you need help with this, ask your health care  provider. Care for your body    Keep your immunizations up to date. In addition to getting vaccinations as told by your health care provider, it is recommended that you get vaccinated against the following illnesses:  The flu (influenza). Get a flu shot every year.  Pneumonia.  Hepatitis B.  Schedule an eye exam soon after your diagnosis, and then one time every year after that.  Check your skin and feet every day for cuts, bruises, redness, blisters, or sores. Schedule a foot exam with your health care provider once every year.  Brush your teeth and gums two times a day, and floss at least one time a day. Visit your dentist at least once every 6 months.  Maintain a healthy weight. General instructions   Take over-the-counter and prescription medicines only as told by your health care provider.  Share your diabetes management plan with people in your workplace, school, and household.  Check your urine for ketones when you are ill and as told by your health care provider.  Ask your health care provider:  Do I need to meet with a diabetes educator?  Where can I find a support group for people with diabetes?  Carry a medical alert card or wear medical alert jewelry.  Keep all follow-up visits as told by your health care  provider. This is important. Where to find more information: For more information about diabetes, visit:  American Diabetes Association (ADA): www.diabetes.org  American Association of Diabetes Educators (AADE): www.diabeteseducator.org/patient-resources This information is not intended to replace advice given to you by your health care provider. Make sure you discuss any questions you have with your health care provider. Document Released: 09/13/2015 Document Revised: 10/28/2015 Document Reviewed: 06/25/2015 Elsevier Interactive Patient Education  2017 [employer].

## 2016-08-10 NOTE — Progress Notes (Signed)
Patient is here for F/up HTN & DM  Patient has eaten today & has taking his med today  Patientd enies pain today

## 2016-08-11 MED FILL — METFORMIN HCL ER 500 MG TAB: 500 | 30 days supply | Qty: 30 | Fill #0

## 2016-08-11 MED FILL — ?LISINOPRIL 20 MG TABLET: 20 | 30 days supply | Qty: 30 | Fill #0

## 2016-08-11 MED FILL — TERBINAFINE HCL 250 MG TAB: 250 | 14 days supply | Qty: 14 | Fill #0

## 2016-08-15 MED FILL — TRUE METRIX TEST STRIP: 16 days supply | Qty: 100 | Fill #1

## 2016-08-16 ENCOUNTER — Ambulatory Visit: Payer: Self-pay | Attending: Family Medicine

## 2016-08-24 ENCOUNTER — Ambulatory Visit: Payer: Self-pay | Admitting: Pharmacist

## 2016-08-24 NOTE — Progress Notes (Deleted)
    S:     No chief complaint on file.   Patient arrives in good spirits.  Presents for diabetes evaluation, education, and management at the request of Arrie SenateMandesia Hairston NP/Dr. Hyman HopesJegede. Patient was referred on 08/10/16.  Patient was last seen by Primary Care Provider on 08/10/16.   Patient reports Diabetes was diagnosed in 2018   Family/Social History: strong family history of diabetes  Patient {Actions; denies-reports:120008} adherence with medications.  Current diabetes medications include: metformin XL 500 mg daily.   Patient {Actions; denies-reports:120008} hypoglycemic events.  Patient reported dietary habits: Eats *** meals/day Breakfast:*** Lunch:*** Dinner:*** Snacks:*** Drinks:***  Patient reported exercise habits:    Patient {Actions; denies-reports:120008} nocturia.  Patient {Actions; denies-reports:120008} neuropathy. Patient {Actions; denies-reports:120008} visual changes. Patient {Actions; denies-reports:120008} self foot exams.   Patient was recently discharged from hospital and all medications have been reviewed.   O:  Physical Exam   ROS   Lab Results  Component Value Date   HGBA1C 14.2 07/27/2016   There were no vitals filed for this visit.  Home fasting CBG: ***  2 hour post-prandial/random CBG: ***.   A/P: Diabetes newly diagnosed currently UNcontrolled based on A1c of 14.2. Patient {Actions; denies-reports:120008} hypoglycemic events and is able to verbalize appropriate hypoglycemia management plan. Patient {Actions; denies-reports:120008} adherence with medication. Control is suboptimal due to medication noncompliance and dietary indiscretion and sedentary lifestyle.    Next A1C anticipated May 2018.    Written patient instructions provided.  Total time in face to face counseling *** minutes.   Follow up in Pharmacist Clinic Visit ***.   Patient seen with Cheral Bayasey Wells, PharmD Candidate

## 2016-08-31 ENCOUNTER — Ambulatory Visit: Payer: Self-pay | Attending: Family Medicine | Admitting: Pharmacist

## 2016-08-31 ENCOUNTER — Encounter: Payer: Self-pay | Admitting: Pharmacist

## 2016-08-31 VITALS — BP 127/83 | HR 82

## 2016-08-31 DIAGNOSIS — E119 Type 2 diabetes mellitus without complications: Secondary | ICD-10-CM

## 2016-08-31 DIAGNOSIS — I10 Essential (primary) hypertension: Secondary | ICD-10-CM

## 2016-08-31 DIAGNOSIS — Z79899 Other long term (current) drug therapy: Secondary | ICD-10-CM

## 2016-08-31 MED ORDER — METFORMIN HCL ER 500 MG PO TB24: 500.0000 mg | ORAL_TABLET | Freq: Two times a day (BID) | ORAL | 0 refills | Status: DC

## 2016-08-31 MED ORDER — LISINOPRIL 20 MG PO TABS: 20.0000 mg | ORAL_TABLET | Freq: Every day | ORAL | 0 refills | Status: DC

## 2016-08-31 NOTE — Patient Instructions (Signed)
Thanks for coming to see us!  Start taking metformin 500 mg - one tablet in the morning and one in the evening

## 2016-08-31 NOTE — Progress Notes (Signed)
    S:     Chief Complaint  Patient presents with  . Medication Management    Patient arrives in good spirits.  Presents for diabetes evaluation, education, and management at the request of Dr. Hyman HopesJegede. Patient was referred on 08/10/16.  Patient was last seen by Primary Care Provider on 08/10/16.   Patient reports adherence with medications.  Current diabetes medications include: metformin XL 500 mg daily.  Current hypertension medications include: lisinopril 20 mg daily  Patient reports hypoglycemic events. He has two readings in the 70s when he was following a very restricted diet.  Patient reported dietary habits: he avoids large portions of carbs and knows that his blood sugar will be elevated if he eats a burrito, which has caused him to be in the 200s.   Patient reported exercise habits: walks   Patient denies nocturia.  Patient denies neuropathy. Patient denies visual changes. Patient reports self foot exams.    O:  Physical Exam   ROS   Lab Results  Component Value Date   HGBA1C 14.2 07/27/2016   There were no vitals filed for this visit.  Home fasting CBG: 70, 73, 80-130  2 hour post-prandial/random CBG: 200s   A/P: Diabetes longstanding currently UNcontrolled based on A1c of 14.2 and post-prandial readings. Patient reports hypoglycemic events and is able to verbalize appropriate hypoglycemia management plan. Patient reports adherence with medication. Control is suboptimal due to dietary indiscretion.  Increase metformin XL to 500 mg BID. This has a low risk of hypoglycemia but will help to better control the post-prandials. Patient encouraged to avoid foods that would cause his blood sugar to spike but to also not restrict his diet so much that it results in lows.  Of note, patient on terbinafine without recent LFTs. Will order CMET today.   Next A1C anticipated May 2018.    Hypertension longstanding currently controlled on lisinopril 20 mg daily.  Patient  reports adherence with medication. Continue current medications as prescribed.  Written patient instructions provided.  Total time in face to face counseling 20 minutes.   Follow up in Pharmacist Clinic Visit PRN, next visit with Dr. Venetia NightAmao.   Patient seen with Cheral Bayasey Wells, PharmD Candidate

## 2016-09-13 MED FILL — METFORMIN HCL ER 500 MG TAB: 500 | 30 days supply | Qty: 60 | Fill #0

## 2016-09-13 MED FILL — TRUE METRIX TEST STRIP: 16 days supply | Qty: 100 | Fill #2

## 2016-09-13 MED FILL — LISINOPRIL 20 MG TABLET: 20 | 30 days supply | Qty: 30 | Fill #0

## 2016-09-13 MED FILL — PRAVASTATIN NA 20 MG TAB: 20 | 30 days supply | Qty: 30 | Fill #1

## 2016-11-10 ENCOUNTER — Ambulatory Visit: Payer: Self-pay | Admitting: Family Medicine

## 2016-11-13 ENCOUNTER — Ambulatory Visit: Payer: Self-pay | Admitting: Family Medicine

## 2017-01-04 ENCOUNTER — Ambulatory Visit: Payer: Self-pay | Admitting: Internal Medicine

## 2017-01-16 ENCOUNTER — Telehealth: Payer: Self-pay | Admitting: Family Medicine

## 2017-01-16 MED ORDER — PRAVASTATIN SODIUM 20 MG PO TABS: 20.0000 mg | ORAL_TABLET | Freq: Every day | ORAL | 0 refills | Status: DC

## 2017-01-16 MED FILL — TRUE METRIX TEST STRIP: 16 days supply | Qty: 100 | Fill #3

## 2017-01-16 MED FILL — LISINOPRIL 20 MG TAB: 20 | 30 days supply | Qty: 30 | Fill #1

## 2017-01-16 MED FILL — METFORMIN HCL ER 500 MG TAB: 500 | 30 days supply | Qty: 60 | Fill #1

## 2017-01-16 NOTE — Telephone Encounter (Signed)
Refilled pravastatin x 30 days -patient needs office visit for refills.

## 2017-01-16 NOTE — Telephone Encounter (Signed)
Pt. Came to facility requesting a refill on his Cholesterol medication. Pt. Uses [facility] pharmacy. Please f/u

## 2017-03-08 ENCOUNTER — Ambulatory Visit: Payer: Self-pay | Admitting: Internal Medicine

## 2017-03-20 MED FILL — PRAVASTATIN NA 20 MG TAB: 20 | 30 days supply | Qty: 30 | Fill #0

## 2017-03-20 MED FILL — LISINOPRIL 20 MG TAB: 20 | 30 days supply | Qty: 30 | Fill #2

## 2017-03-20 MED FILL — METFORMIN HCL ER 500 MG TAB: 500 | 30 days supply | Qty: 60 | Fill #2

## 2017-05-01 ENCOUNTER — Other Ambulatory Visit: Payer: Self-pay | Admitting: Internal Medicine

## 2017-05-01 ENCOUNTER — Other Ambulatory Visit: Payer: Self-pay | Admitting: Family Medicine

## 2017-05-01 DIAGNOSIS — E119 Type 2 diabetes mellitus without complications: Secondary | ICD-10-CM

## 2017-05-01 MED FILL — TRUE METRIX TEST STRIP: 16 days supply | Qty: 100 | Fill #4

## 2017-05-04 ENCOUNTER — Ambulatory Visit: Payer: Self-pay | Attending: Internal Medicine | Admitting: Internal Medicine

## 2017-05-04 ENCOUNTER — Encounter: Payer: Self-pay | Admitting: Internal Medicine

## 2017-05-04 VITALS — BP 131/90 | HR 90 | Temp 98.3°F | Resp 18 | Ht 75.0 in | Wt 249.2 lb

## 2017-05-04 DIAGNOSIS — I10 Essential (primary) hypertension: Secondary | ICD-10-CM | POA: Insufficient documentation

## 2017-05-04 DIAGNOSIS — Z125 Encounter for screening for malignant neoplasm of prostate: Secondary | ICD-10-CM | POA: Insufficient documentation

## 2017-05-04 DIAGNOSIS — Z1211 Encounter for screening for malignant neoplasm of colon: Secondary | ICD-10-CM | POA: Insufficient documentation

## 2017-05-04 DIAGNOSIS — E785 Hyperlipidemia, unspecified: Secondary | ICD-10-CM | POA: Insufficient documentation

## 2017-05-04 DIAGNOSIS — Z880 Allergy status to penicillin: Secondary | ICD-10-CM | POA: Insufficient documentation

## 2017-05-04 DIAGNOSIS — Z23 Encounter for immunization: Secondary | ICD-10-CM | POA: Insufficient documentation

## 2017-05-04 DIAGNOSIS — E119 Type 2 diabetes mellitus without complications: Secondary | ICD-10-CM | POA: Insufficient documentation

## 2017-05-04 DIAGNOSIS — Z833 Family history of diabetes mellitus: Secondary | ICD-10-CM | POA: Insufficient documentation

## 2017-05-04 LAB — GLUCOSE, POCT (MANUAL RESULT ENTRY): POC Glucose: 86 mg/dL (ref 70–99)

## 2017-05-04 MED ORDER — LISINOPRIL 5 MG PO TABS: 5.0000 mg | ORAL_TABLET | Freq: Every day | ORAL | 6 refills | Status: DC

## 2017-05-04 MED ORDER — PRAVASTATIN SODIUM 20 MG PO TABS
20.0000 mg | ORAL_TABLET | Freq: Every day | ORAL | 6 refills | Status: DC
Start: 2017-05-04 — End: 2020-11-03

## 2017-05-04 MED ORDER — METFORMIN HCL ER 500 MG PO TB24: 500.0000 mg | ORAL_TABLET | Freq: Two times a day (BID) | ORAL | 4 refills | Status: DC

## 2017-05-04 MED FILL — METFORMIN HCL ER 500 MG TAB: 500 | 30 days supply | Qty: 60 | Fill #0

## 2017-05-04 MED FILL — ?LISINOPRIL 5 MG TABLET: 5 | 30 days supply | Qty: 30 | Fill #0

## 2017-05-04 MED FILL — PRAVASTATIN NA 20 MG TAB: 20 | 30 days supply | Qty: 30 | Fill #0

## 2017-05-04 NOTE — Progress Notes (Signed)
pocPatient is here to establish care about his diabetes and hypertension. Patient stated that he need medication refills. Patient stated that he would like prescriptions for his ringworm.

## 2017-05-04 NOTE — Progress Notes (Signed)
  Patient ID: Andrew Wang, male    DOB: 05/28/1956  MRN: [id number]  CC: Establish Care   Subjective: Andrew Wang is a 61 y.o. male who presents for chronic ds management.  Last seen 08/2016 His concerns today include:  HTN, HL, DM 1. DM: -compliant with Metformin but out x 1 wk No checking BS. Needs new battery for glucometer Eating habits: good. He avoids too much white carbs, junk foods.  Exercise: walking 2 x a wk for exercise No numbness in feet No blurred vision. Over due for eye exam   2. HTTN: out of Lisinopril x 1 wk No CP/SOB/LE edema  HM: due for flu shot, tdap and colon cancer screen Current Outpatient Medications on File Prior to Visit  Medication Sig Dispense Refill  . Blood Glucose Monitoring Suppl (TRUE METRIX METER) DEVI 1 kit by Does not apply route 4 (four) times daily. (Patient not taking: Reported on 05/04/2017) 1 Device 0  . glucose blood (TRUE METRIX BLOOD GLUCOSE TEST) test strip Use as instructed (Patient not taking: Reported on 05/04/2017) 100 each 12  . TRUEPLUS LANCETS 28G MISC 1 Stick by Does not apply route 4 (four) times daily. (Patient not taking: Reported on 05/04/2017) 120 each 1   No current facility-administered medications on file prior to visit.     Allergies  Allergen Reactions  . Penicillins Other (See Comments)    Childhood allergy        Social History   Socioeconomic History  . Marital status: Single    Spouse name: Not on file  . Number of children: Not on file  . Years of education: Not on file  . Highest education level: Not on file  Social Needs  . Financial resource strain: Not on file  . Food insecurity - worry: Not on file  . Food insecurity - inability: Not on file  . Transportation needs - medical: Not on file  . Transportation needs - non-medical: Not on file  Occupational History  . Not on file  Tobacco Use  . Smoking status: Never Smoker  . Smokeless tobacco: Never Used  Substance and Sexual Activity  .  Alcohol use: No  . Drug use: No  . Sexual activity: Yes    Birth control/protection: None  Other Topics Concern  . Not on file  Social History Narrative  . Not on file    FHx: mother had DM History reviewed. No pertinent surgical history.  ROS: Review of Systems Neg except as above PHYSICAL EXAM: BP 131/90 (BP Location: Left Arm, Patient Position: Sitting, Cuff Size: Large)   Pulse 90   Temp 98.3 F (36.8 C) (Oral)   Resp 18   Ht 6' 3" (1.905 m)   Wt 249 lb 3.2 oz (113 kg)   SpO2 96%   BMI 31.15 kg/m   Wt Readings from Last 3 Encounters:  05/04/17 249 lb 3.2 oz (113 kg)  08/10/16 243 lb 6.4 oz (110.4 kg)  07/27/16 239 lb (108.4 kg)    Physical Exam  General appearance - alert, well appearing, well developed older AAM and in no distress Mental status - very talkative,  Eyes - pupils equal and reactive, extraocular eye movements intact Mouth - mucous membranes moist, pharynx normal without lesions. Several teeth broken off in gum Neck - supple, no significant adenopathy Chest - clear to auscultation, no wheezes, rales or rhonchi, symmetric air entry Heart - normal rate, regular rhythm, normal S1, S2, no murmurs, rubs, clicks or gallops   Extremities - peripheral pulses normal, no pedal edema, no clubbing or cyanosis Diabetic Foot Exam - Simple   Simple Foot Form Visual Inspection No deformities, no ulcerations, no other skin breakdown bilaterally:  Yes Sensation Testing Intact to touch and monofilament testing bilaterally:  Yes Pulse Check Posterior Tibialis and Dorsalis pulse intact bilaterally:  Yes Comments Toenails thick and discolored     Depression screen [facility] 2/9 05/04/2017 08/10/2016 07/27/2016  Decreased Interest 0 0 0  Down, Depressed, Hopeless 0 0 0  PHQ - 2 Score 0 0 0  Altered sleeping 0 0 -  Tired, decreased energy 0 0 -  Change in appetite 0 0 -  Feeling bad or failure about yourself  0 0 -  Trouble concentrating 0 0 -  Moving slowly or  fidgety/restless 0 0 -  Suicidal thoughts 0 0 -  PHQ-9 Score 0 0 -   Lab Results  Component Value Date   HGBA1C 14.2 07/27/2016    ASSESSMENT AND PLAN: 1. Essential hypertension BP not bad given that he has been off med for over 1 wk. RF Lisinopril but at lower dose of 5 mg - lisinopril (PRINIVIL,ZESTRIL) 5 MG tablet; Take 1 tablet (5 mg total) by mouth daily.  Dispense: 30 tablet; Refill: 6  2. Type 2 diabetes mellitus without complication, without long-term current use of insulin (HCC) BS today good Will check A1C RF Metformin Continue healthy eating and regular exercise - Glucose (CBG) - Hemoglobin A1c - CBC - Comprehensive metabolic panel - Lipid panel - Microalbumin / creatinine urine ratio - metFORMIN (GLUCOPHAGE-XR) 500 MG 24 hr tablet; Take 1 tablet (500 mg total) by mouth 2 (two) times daily with a meal.  Dispense: 180 tablet; Refill: 4  3. Prostate cancer screening Discussed risks of screening. Pt agreeable to screening - PSA  4. Need for Tdap vaccination - Tdap vaccine greater than or equal to 7yo IM  5. Colon cancer screening - Fecal occult blood, imunochemical  6. Hyperlipidemia, unspecified hyperlipidemia type - pravastatin (PRAVACHOL) 20 MG tablet; Take 1 tablet (20 mg total) by mouth daily.  Dispense: 30 tablet; Refill: 6  Patient was given the opportunity to ask questions.  Patient verbalized understanding of the plan and was able to repeat key elements of the plan.   Orders Placed This Encounter  Procedures  . Fecal occult blood, imunochemical  . Tdap vaccine greater than or equal to 7yo IM  . Hemoglobin A1c  . CBC  . Comprehensive metabolic panel  . Lipid panel  . Microalbumin / creatinine urine ratio  . PSA  . Glucose (CBG)     Requested Prescriptions   Signed Prescriptions Disp Refills  . pravastatin (PRAVACHOL) 20 MG tablet 30 tablet 6    Sig: Take 1 tablet (20 mg total) by mouth daily.  [name] lisinopril (PRINIVIL,ZESTRIL) 5 MG tablet 30  tablet 6    Sig: Take 1 tablet (5 mg total) by mouth daily.  . metFORMIN (GLUCOPHAGE-XR) 500 MG 24 hr tablet 180 tablet 4    Sig: Take 1 tablet (500 mg total) by mouth 2 (two) times daily with a meal.    Return in about 3 months (around 08/02/2017).  Karle Plumber, MD, FACP

## 2017-05-04 NOTE — Patient Instructions (Signed)
We have decrease the dose of your blood pressure medication Lisinopril to 5 mg daily.

## 2017-05-05 LAB — CBC
Hematocrit: 50.4 % (ref 37.5–51.0)
Hemoglobin: 17.3 g/dL (ref 13.0–17.7)
MCH: 29.6 pg (ref 26.6–33.0)
MCHC: 34.3 g/dL (ref 31.5–35.7)
MCV: 86 fL (ref 79–97)
Platelets: 202 10*3/uL (ref 150–379)
RBC: 5.84 x10E6/uL — ABNORMAL HIGH (ref 4.14–5.80)
RDW: 13.9 % (ref 12.3–15.4)
WBC: 9.7 10*3/uL (ref 3.4–10.8)

## 2017-05-05 LAB — COMPREHENSIVE METABOLIC PANEL
ALK PHOS: 107 IU/L (ref 39–117)
ALT: 35 IU/L (ref 0–44)
AST: 28 IU/L (ref 0–40)
Albumin/Globulin Ratio: 1.4 (ref 1.2–2.2)
Albumin: 4.4 g/dL (ref 3.6–4.8)
BUN/Creatinine Ratio: 8 — ABNORMAL LOW (ref 10–24)
BUN: 9 mg/dL (ref 8–27)
Bilirubin Total: 0.5 mg/dL (ref 0.0–1.2)
CO2: 26 mmol/L (ref 20–29)
CREATININE: 1.13 mg/dL (ref 0.76–1.27)
Calcium: 9.4 mg/dL (ref 8.6–10.2)
Chloride: 97 mmol/L (ref 96–106)
GFR calc Af Amer: 80 mL/min/{1.73_m2} (ref >59)
GFR calc non Af Amer: 69 mL/min/{1.73_m2} (ref >59)
GLUCOSE: 78 mg/dL (ref 65–99)
Globulin, Total: 3.1 g/dL (ref 1.5–4.5)
Potassium: 4.2 mmol/L (ref 3.5–5.2)
SODIUM: 139 mmol/L (ref 134–144)
Total Protein: 7.5 g/dL (ref 6.0–8.5)

## 2017-05-05 LAB — LIPID PANEL
CHOLESTEROL TOTAL: 167 mg/dL (ref 100–199)
Chol/HDL Ratio: 3.9 ratio (ref 0.0–5.0)
HDL: 43 mg/dL (ref >39)
LDL CALC: 99 mg/dL (ref 0–99)
TRIGLYCERIDES: 124 mg/dL (ref 0–149)
VLDL CHOLESTEROL CAL: 25 mg/dL (ref 5–40)

## 2017-05-05 LAB — MICROALBUMIN / CREATININE URINE RATIO
Creatinine, Urine: 280.9 mg/dL
MICROALB/CREAT RATIO: 15.9 mg/g{creat} (ref 0.0–30.0)
Microalbumin, Urine: 44.6 ug/mL

## 2017-05-05 LAB — PSA: Prostate Specific Ag, Serum: 3.6 ng/mL (ref 0.0–4.0)

## 2017-05-05 LAB — HEMOGLOBIN A1C
ESTIMATED AVERAGE GLUCOSE: 137 mg/dL
Hgb A1c MFr Bld: 6.4 % — ABNORMAL HIGH (ref 4.8–5.6)

## 2017-05-07 ENCOUNTER — Telehealth: Payer: Self-pay

## 2017-05-07 NOTE — Telephone Encounter (Signed)
-----   Message from Marcine Matareborah B Johnson, MD sent at 05/05/2017  9:27 PM EST ----- Let pt know that his blood count, kidney and liver function tests and cholesterol levels are all normal.

## 2017-05-07 NOTE — Telephone Encounter (Signed)
Patient inform on lab result.  Patient verified DOB.  

## 2017-05-07 NOTE — Telephone Encounter (Signed)
-----   Message from Deborah B Johnson, MD sent at 05/05/2017  9:27 PM EST ----- Let pt know that his blood count, kidney and liver function tests and cholesterol levels are all normal. 

## 2017-08-13 MED FILL — LISINOPRIL 5 MG TAB: 5 | 30 days supply | Qty: 30 | Fill #1

## 2017-08-13 MED FILL — METFORMIN HCL ER 500 MG TAB: 500 | 30 days supply | Qty: 60 | Fill #1

## 2017-08-13 MED FILL — PRAVASTATIN NA 20 MG TAB: 20 | 30 days supply | Qty: 30 | Fill #1

## 2020-11-03 ENCOUNTER — Other Ambulatory Visit: Payer: Self-pay

## 2020-11-03 ENCOUNTER — Ambulatory Visit: Payer: Medicare Other | Admitting: Physician Assistant

## 2020-11-03 VITALS — BP 149/96 | HR 95 | Temp 99.2°F | Ht 75.0 in | Wt 250.0 lb

## 2020-11-03 DIAGNOSIS — E119 Type 2 diabetes mellitus without complications: Secondary | ICD-10-CM

## 2020-11-03 DIAGNOSIS — I10 Essential (primary) hypertension: Secondary | ICD-10-CM | POA: Diagnosis not present

## 2020-11-03 DIAGNOSIS — R351 Nocturia: Secondary | ICD-10-CM

## 2020-11-03 DIAGNOSIS — Z1211 Encounter for screening for malignant neoplasm of colon: Secondary | ICD-10-CM

## 2020-11-03 DIAGNOSIS — Z6831 Body mass index (BMI) 31.0-31.9, adult: Secondary | ICD-10-CM

## 2020-11-03 DIAGNOSIS — E785 Hyperlipidemia, unspecified: Secondary | ICD-10-CM | POA: Diagnosis not present

## 2020-11-03 DIAGNOSIS — E6609 Other obesity due to excess calories: Secondary | ICD-10-CM

## 2020-11-03 DIAGNOSIS — Z125 Encounter for screening for malignant neoplasm of prostate: Secondary | ICD-10-CM

## 2020-11-03 DIAGNOSIS — Z114 Encounter for screening for human immunodeficiency virus [HIV]: Secondary | ICD-10-CM

## 2020-11-03 DIAGNOSIS — Z1159 Encounter for screening for other viral diseases: Secondary | ICD-10-CM

## 2020-11-03 LAB — POCT GLYCOSYLATED HEMOGLOBIN (HGB A1C): Hemoglobin A1C: 6.3 % — AB (ref 4.0–5.6)

## 2020-11-03 MED ORDER — ONETOUCH VERIO VI STRP
1.0000 | ORAL_STRIP | Freq: Two times a day (BID) | 12 refills | Status: DC
Start: 2020-11-03 — End: 2023-09-26

## 2020-11-03 MED ORDER — ONETOUCH DELICA PLUS LANCET33G MISC: 1.0000 [IU] | Freq: Two times a day (BID) | 12 refills | Status: DC

## 2020-11-03 MED ORDER — ONETOUCH VERIO W/DEVICE KIT: 1.0000 [IU] | PACK | Freq: Two times a day (BID) | 0 refills | Status: DC

## 2020-11-03 MED ORDER — SIMVASTATIN 20 MG PO TABS: 20.0000 mg | ORAL_TABLET | Freq: Every day | ORAL | 1 refills | Status: DC

## 2020-11-03 MED ORDER — LISINOPRIL 20 MG PO TABS: 20.0000 mg | ORAL_TABLET | Freq: Every day | ORAL | 1 refills | Status: DC

## 2020-11-03 MED ORDER — METFORMIN HCL ER 500 MG PO TB24: 500.0000 mg | ORAL_TABLET | Freq: Two times a day (BID) | ORAL | 1 refills | Status: DC

## 2020-11-03 NOTE — Progress Notes (Signed)
New Patient Office Visit  Subjective:  Patient ID: Andrew Wang, male    DOB: [birth year]  Age: 65 y.o. MRN: [id number]  CC:  Chief Complaint  Patient presents with  . Medication Refill    DM+HTN    HPI Andrew Wang reports that he has been out of his blood pressure medication for the past 3 days.  Reports that he has otherwise been able to be compliant to his medications, states that he has not been seen by primary care provider since 2018 due to incarceration, but states that he was given a supply of medication after his release.  Reports that he does check his blood pressure and blood glucose at home, request more testing strips.  Reports that his blood pressure is well controlled while taking this medication.  Reports that he has been working to improve his lifestyle to help his blood glucose levels.  No other concerns at this time 7/14 to dr Joya Gaskins  States that he was incarcerated and was given medication  States that he has been out of his BP med for hte past 3 days    Medicare insurance     Past Medical History:  Diagnosis Date  . Hypertension   . Shingles     History reviewed. No pertinent surgical history.  Family History  Problem Relation Age of Onset  . Diabetes Mother     Social History   Socioeconomic History  . Marital status: Single    Spouse name: Not on file  . Number of children: Not on file  . Years of education: Not on file  . Highest education level: Not on file  Occupational History  . Not on file  Tobacco Use  . Smoking status: Never Smoker  . Smokeless tobacco: Never Used  Substance and Sexual Activity  . Alcohol use: No  . Drug use: No  . Sexual activity: Yes    Birth control/protection: None  Other Topics Concern  . Not on file  Social History Narrative  . Not on file   Social Determinants of Health   Financial Resource Strain: Not on file  Food Insecurity: Not on file  Transportation Needs: Not on file  Physical Activity:  Not on file  Stress: Not on file  Social Connections: Not on file  Intimate Partner Violence: Not on file    ROS Review of Systems  Constitutional: Negative for chills and fever.  HENT: Negative.   Eyes: Negative.   Respiratory: Negative for shortness of breath.   Cardiovascular: Negative for chest pain.  Gastrointestinal: Negative.   Endocrine: Negative.   Genitourinary: Negative.   Musculoskeletal: Negative.   Skin: Negative.   Allergic/Immunologic: Negative.   Neurological: Negative.   Hematological: Negative.   Psychiatric/Behavioral: Negative.     Objective:   Today's Vitals: BP (!) 149/96   Pulse 95   Temp 99.2 F (37.3 C)   Ht 6' 3"  (1.905 m)   Wt 250 lb (113.4 kg)   BMI 31.25 kg/m   Physical Exam Vitals reviewed.  Constitutional:      Appearance: Normal appearance.  HENT:     Head: Normocephalic and atraumatic.     Right Ear: External ear normal.     Left Ear: External ear normal.     Nose: Nose normal.     Mouth/Throat:     Mouth: Mucous membranes are moist.     Pharynx: Oropharynx is clear.  Eyes:     Extraocular Movements: Extraocular movements intact.  Conjunctiva/sclera: Conjunctivae normal.     Pupils: Pupils are equal, round, and reactive to light.  Cardiovascular:     Rate and Rhythm: Normal rate and regular rhythm.     Pulses: Normal pulses.     Heart sounds: Normal heart sounds.  Pulmonary:     Effort: Pulmonary effort is normal.     Breath sounds: Normal breath sounds.  Musculoskeletal:        General: Normal range of motion.     Cervical back: Normal range of motion and neck supple.  Skin:    General: Skin is warm and dry.  Neurological:     General: No focal deficit present.     Mental Status: He is alert and oriented to person, place, and time.  Psychiatric:        Mood and Affect: Mood normal.        Behavior: Behavior normal.        Thought Content: Thought content normal.        Judgment: Judgment normal.      Assessment & Plan:   Problem List Items Addressed This Visit      Cardiovascular and Mediastinum   Essential hypertension - Primary   Relevant Medications   lisinopril (ZESTRIL) 20 MG tablet   simvastatin (ZOCOR) 20 MG tablet   Other Relevant Orders   TSH     Endocrine   Type 2 diabetes mellitus without complication, without long-term current use of insulin (HCC)   Relevant Medications   metFORMIN (GLUCOPHAGE-XR) 500 MG 24 hr tablet   lisinopril (ZESTRIL) 20 MG tablet   simvastatin (ZOCOR) 20 MG tablet   glucose blood (ONETOUCH VERIO) test strip   Blood Glucose Monitoring Suppl (ONETOUCH VERIO) w/Device KIT   Lancets (ONETOUCH DELICA PLUS [id number]) MISC   Other Relevant Orders   CBC with Differential/Platelet   Comp. Metabolic Panel (12)   Microalbumin / creatinine urine ratio   HgB A1c (Completed)     Other   Hyperlipidemia   Relevant Medications   lisinopril (ZESTRIL) 20 MG tablet   simvastatin (ZOCOR) 20 MG tablet   Other Relevant Orders   Lipid panel    Other Visit Diagnoses    Class 1 obesity due to excess calories with serious comorbidity and body mass index (BMI) of 31.0 to 31.9 in adult       Relevant Medications   metFORMIN (GLUCOPHAGE-XR) 500 MG 24 hr tablet   Screening for HIV (human immunodeficiency virus)       Relevant Orders   HIV antibody (with reflex)   Encounter for HCV screening test for low risk patient       Relevant Orders   HCV Ab w Reflex to Quant PCR   Screen for colon cancer       Screening PSA (prostate specific antigen)       Relevant Orders   PSA   Nocturia        Relevant Orders   PSA      Outpatient Encounter Medications as of 11/03/2020  Medication Sig  . Blood Glucose Monitoring Suppl (ONETOUCH VERIO) w/Device KIT 1 Units by Does not apply route in the morning and at bedtime.  [name] glucose blood (ONETOUCH VERIO) test strip 1 each by Other route 2 (two) times daily.  . Lancets (ONETOUCH DELICA PLUS [id number]) MISC 1 Units  by Does not apply route in the morning and at bedtime.  . simvastatin (ZOCOR) 20 MG tablet Take 1 tablet (20 mg total) by mouth  at bedtime.  [name] lisinopril (ZESTRIL) 20 MG tablet Take 1 tablet (20 mg total) by mouth daily.  . metFORMIN (GLUCOPHAGE-XR) 500 MG 24 hr tablet Take 1 tablet (500 mg total) by mouth 2 (two) times daily with a meal.  . TRUEPLUS LANCETS 28G MISC 1 Stick by Does not apply route 4 (four) times daily. (Patient not taking: Reported on 05/04/2017)  . [DISCONTINUED] Blood Glucose Monitoring Suppl (TRUE METRIX METER) DEVI 1 kit by Does not apply route 4 (four) times daily. (Patient not taking: Reported on 05/04/2017)  . [DISCONTINUED] glucose blood (TRUE METRIX BLOOD GLUCOSE TEST) test strip Use as instructed (Patient not taking: Reported on 05/04/2017)  . [DISCONTINUED] lisinopril (PRINIVIL,ZESTRIL) 5 MG tablet Take 1 tablet (5 mg total) by mouth daily.  . [DISCONTINUED] metFORMIN (GLUCOPHAGE-XR) 500 MG 24 hr tablet Take 1 tablet (500 mg total) by mouth 2 (two) times daily with a meal.  . [DISCONTINUED] pravastatin (PRAVACHOL) 20 MG tablet Take 1 tablet (20 mg total) by mouth daily.   No facility-administered encounter medications on file as of 11/03/2020.   1. Essential hypertension Patient has upcoming appointment to reestablish care at [facility] on July 14 with Dr. Joya Gaskins.  Resume current regimen.  Patient did have prescription bottles with him.  Patient encouraged to check blood pressure at home on a daily basis, keep a written log and have available for all office visits. [facility] for fasting labs - lisinopril (ZESTRIL) 20 MG tablet; Take 1 tablet (20 mg total) by mouth daily.  Dispense: 30 tablet; Refill: 1 - TSH; Future  2. Type 2 diabetes mellitus without complication, without long-term current use of insulin (HCC) A1c 6.3.  Continue current regimen.  Patient encouraged to check  blood glucose at home several times a week, keep a written log and have available for all office visits. - metFORMIN (GLUCOPHAGE-XR) 500 MG 24 hr tablet; Take 1 tablet (500 mg total) by mouth 2 (two) times daily with a meal.  Dispense: 60 tablet; Refill: 1 - CBC with Differential/Platelet; Future - Comp. Metabolic Panel (12); Future - Microalbumin / creatinine urine ratio; Future - glucose blood (ONETOUCH VERIO) test strip; 1 each by Other route 2 (two) times daily.  Dispense: 100 each; Refill: 12 - Blood Glucose Monitoring Suppl (ONETOUCH VERIO) w/Device KIT; 1 Units by Does not apply route in the morning and at bedtime.  Dispense: 1 kit; Refill: 0 - Lancets (ONETOUCH DELICA PLUS [id number]) MISC; 1 Units by Does not apply route in the morning and at bedtime.  Dispense: 100 each; Refill: 12 - HgB A1c  3. Hyperlipidemia, unspecified hyperlipidemia type Continue current regimen - simvastatin (ZOCOR) 20 MG tablet; Take 1 tablet (20 mg total) by mouth at bedtime.  Dispense: 30 tablet; Refill: 1 - Lipid panel; Future  4. Class 1 obesity due to excess calories with serious comorbidity and body mass index (BMI) of 31.0 to 31.9 in adult   5. Screening for HIV (human immunodeficiency virus)  - HIV antibody (with reflex); Future  6. Encounter for HCV screening test for low risk patient  - HCV Ab w Reflex to Quant PCR; Future  7. Screen for colon cancer Patient education given on colon cancer screening.  Patient declines referral for colonoscopy or Cologuard at this time.  Encouraged patient to reconsider and discuss again with Dr. Joya Gaskins during his office visit.  8. Screening PSA (prostate specific antigen)  - PSA; Future  I have reviewed the patient's medical history (PMH, PSH, Social History, Family History, Medications, and allergies) , and have been updated if relevant. I spent 32 minutes reviewing chart and  face to face time with patient.     Follow-up: Return in about 1 day  (around 11/04/2020) for Fasting  labs, At [facility].   Loraine Grip Mayers, PA-C

## 2020-11-03 NOTE — Patient Instructions (Signed)
I sent your prescription refills to your pharmacy along with a blood sugar monitoring system.  Please [facility] tomorrow morning for your fasting labs.  We will call you with the lab results.  Please let us know if there is anything else we can do for you.  Roney Jaffe, PA-C Physician Assistant [facility] [email or web address]    Colonoscopy, Adult A colonoscopy is a procedure to look at the entire large intestine. This procedure is done using a long, thin, flexible tube that has a camera on the end. You may have a colonoscopy:  As a part of normal colorectal screening.  If you have certain symptoms, such as: ? A low number of red blood cells in your blood (anemia). ? Diarrhea that does not go away. ? Pain in your abdomen. ? Blood in your stool. A colonoscopy can help screen for and diagnose medical problems, including:  Tumors.  Extra tissue that grows where mucus forms (polyps).  Inflammation.  Areas of bleeding. Tell your health care provider about:  Any allergies you have.  All medicines you are taking, including vitamins, herbs, eye drops, creams, and over-the-counter medicines.  Any problems you or family members have had with anesthetic medicines.  Any blood disorders you have.  Any surgeries you have had.  Any medical conditions you have.  Any problems you have had with having bowel movements.  Whether you are pregnant or may be pregnant. What are the risks? Generally, this is a safe procedure. However, problems may occur, including:  Bleeding.  Damage to your intestine.  Allergic reactions to medicines given during the procedure.  Infection. This is rare. What happens before the procedure? Eating and drinking restrictions Follow instructions from your health care provider about eating or drinking restrictions, which may include:  A few days before the  procedure: ? Follow a low-fiber diet. ? Avoid nuts, seeds, dried fruit, raw fruits, and vegetables.  1-3 days before the procedure: ? Eat only gelatin dessert or ice pops. ? Drink only clear liquids, such as water, clear juice, clear broth or bouillon, black coffee or tea, or clear soft drinks or sports drinks. ? Avoid liquids that contain red or purple dye.  The day of the procedure: ? Do not eat solid foods. You may continue to drink clear liquids until up to 2 hours before the procedure. ? Do not eat or drink anything starting 2 hours before the procedure, or within the time period that your health care provider recommends. Bowel prep If you were prescribed a bowel prep to take by mouth (orally) to clean out your colon:  Take it as told by your health care provider. Starting the day before your procedure, you will need to drink a large amount of liquid medicine. The liquid will cause you to have many bowel movements of loose stool until your stool becomes almost clear or light green.  If your skin or the opening between the buttocks (anus) gets irritated from diarrhea, you may relieve the irritation using: ? Wipes with medicine in them, such as adult wet wipes with aloe and vitamin E. ? A product to soothe skin, such as petroleum jelly.  If you vomit while drinking the bowel prep: ? Take a break for up to 60 minutes. ? Begin the bowel prep again. ? Call your health care provider if you keep vomiting or you cannot take the bowel prep without vomiting.  To clean out your colon, you  may also be given: ? Laxative medicines. These help you have a bowel movement. ? Instructions for enema use. An enema is liquid medicine injected into your rectum. Medicines Ask your health care provider about:  Changing or stopping your regular medicines or supplements. This is especially important if you are taking iron supplements, diabetes medicines, or blood thinners.  Taking medicines such as  aspirin and ibuprofen. These medicines can thin your blood. Do not take these medicines unless your health care provider tells you to take them.  Taking over-the-counter medicines, vitamins, herbs, and supplements. General instructions  Ask your health care provider what steps will be taken to help prevent infection. These may include washing skin with a germ-killing soap.  Plan to have someone take you home from the hospital or clinic. What happens during the procedure?  An IV will be inserted into one of your veins.  You may be given one or more of the following: ? A medicine to help you relax (sedative). ? A medicine to numb the area (local anesthetic). ? A medicine to make you fall asleep (general anesthetic). This is rarely needed.  You will lie on your side with your knees bent.  The tube will: ? Have oil or gel put on it (be lubricated). ? Be inserted into your anus. ? Be gently eased through all parts of your large intestine.  Air will be sent into your colon to keep it open. This may cause some pressure or cramping.  Images will be taken with the camera and will appear on a screen.  A small tissue sample may be removed to be looked at under a microscope (biopsy). The tissue may be sent to a lab for testing if any signs of problems are found.  If small polyps are found, they may be removed and checked for cancer cells.  When the procedure is finished, the tube will be removed. The procedure may vary among health care providers and hospitals.   What happens after the procedure?  Your blood pressure, heart rate, breathing rate, and blood oxygen level will be monitored until you leave the hospital or clinic.  You may have a small amount of blood in your stool.  You may pass gas and have mild cramping or bloating in your abdomen. This is caused by the air that was used to open your colon during the exam.  Do not drive for 24 hours after the procedure.  It is up to you  to get the results of your procedure. Ask your health care provider, or the department that is doing the procedure, when your results will be ready. Summary  A colonoscopy is a procedure to look at the entire large intestine.  Follow instructions from your health care provider about eating and drinking before the procedure.  If you were prescribed an oral bowel prep to clean out your colon, take it as told by your health care provider.  During the colonoscopy, a flexible tube with a camera on its end is inserted into the anus and then passed into the other parts of the large intestine. This information is not intended to replace advice given to you by your health care provider. Make sure you discuss any questions you have with your health care provider. Document Revised: 12/13/2018 Document Reviewed: 12/13/2018 Elsevier Patient Education  2021 [employer].

## 2020-11-04 ENCOUNTER — Other Ambulatory Visit: Payer: Medicare Other

## 2020-12-16 ENCOUNTER — Ambulatory Visit: Payer: Self-pay | Admitting: Critical Care Medicine

## 2021-09-08 ENCOUNTER — Encounter (HOSPITAL_COMMUNITY): Payer: Self-pay

## 2021-09-08 ENCOUNTER — Emergency Department (HOSPITAL_COMMUNITY)
Admission: EM | Admit: 2021-09-08 | Discharge: 2021-09-08 | Disposition: A | Discharge status: Home / Self Care | Payer: Medicare Other | Attending: Emergency Medicine | Admitting: Emergency Medicine

## 2021-09-08 ENCOUNTER — Other Ambulatory Visit: Payer: Self-pay

## 2021-09-08 DIAGNOSIS — I10 Essential (primary) hypertension: Secondary | ICD-10-CM | POA: Insufficient documentation

## 2021-09-08 DIAGNOSIS — R21 Rash and other nonspecific skin eruption: Secondary | ICD-10-CM | POA: Diagnosis present

## 2021-09-08 DIAGNOSIS — L2082 Flexural eczema: Secondary | ICD-10-CM

## 2021-09-08 DIAGNOSIS — Z7984 Long term (current) use of oral hypoglycemic drugs: Secondary | ICD-10-CM | POA: Insufficient documentation

## 2021-09-08 DIAGNOSIS — E119 Type 2 diabetes mellitus without complications: Secondary | ICD-10-CM | POA: Insufficient documentation

## 2021-09-08 DIAGNOSIS — Z79899 Other long term (current) drug therapy: Secondary | ICD-10-CM | POA: Diagnosis not present

## 2021-09-08 HISTORY — DX: Type 2 diabetes mellitus without complications: E11.9

## 2021-09-08 MED ORDER — BETAMETHASONE VALERATE 0.1 % EX OINT: 1.0000 "application " | TOPICAL_OINTMENT | Freq: Two times a day (BID) | CUTANEOUS | 0 refills | Status: DC

## 2021-09-08 NOTE — ED Provider Notes (Addendum)
?[facility] DEPT ?Provider Note ? ? ?CSN: [id number] ?Arrival date & time: 09/08/21  1947 ? ?  ? ?History ? ?Chief Complaint  ?Patient presents with  ? Rash  ? ? ?Andrew Wang is a 66 y.o. male with past medical history of hypertension, diabetes mellitus type 2, hyperlipidemia, obesity.  Presents to the emergency department for complaint of rash to left before meals and bilateral popliteal areas.  Patient states that rash has been present for the last week.  Rash has been gradually better over this time.  Patient states that he has been applying moisturizers daily.  Over the last 2 days patient tried using alcohol and tea tree oil which seem to make the area worse.  Patient endorses pruritus to affected areas.  Patient states that he had this once last year and resolved on its own. ? ?Denies any fever, chills, wounds, numbness, weakness. ? ? ?Rash ?Associated symptoms: no fever, no nausea and not vomiting   ? ?  ? ?Home Medications ?Prior to Admission medications   ?Medication Sig Start Date End Date Taking? Authorizing Provider  ?Blood Glucose Monitoring Suppl (ONETOUCH VERIO) w/Device KIT 1 Units by Does not apply route in the morning and at bedtime. 11/03/20   Mayers, Cari S, PA-C  ?glucose blood (ONETOUCH VERIO) test strip 1 each by Other route 2 (two) times daily. 11/03/20   Mayers, Loraine Grip, PA-C  ?Lancets (ONETOUCH DELICA PLUS [id number]) MISC 1 Units by Does not apply route in the morning and at bedtime. 11/03/20   Mayers, Cari S, PA-C  ?lisinopril (ZESTRIL) 20 MG tablet Take 1 tablet (20 mg total) by mouth daily. 11/03/20   Mayers, Cari S, PA-C  ?metFORMIN (GLUCOPHAGE-XR) 500 MG 24 hr tablet Take 1 tablet (500 mg total) by mouth 2 (two) times daily with a meal. 11/03/20   Mayers, Cari S, PA-C  ?simvastatin (ZOCOR) 20 MG tablet Take 1 tablet (20 mg total) by mouth at bedtime. 11/03/20   Mayers, Loraine Grip, PA-C  ?TRUEPLUS LANCETS 28G MISC 1 Stick by Does not apply route 4 (four) times daily. ?Patient  not taking: Reported on 05/04/2017 07/27/16   Brayton Caves, PA-C  ?   ? ?Allergies    ?Penicillins   ? ?Review of Systems   ?Review of Systems  ?Constitutional:  Negative for chills and fever.  ?Gastrointestinal:  Negative for nausea and vomiting.  ?Musculoskeletal:  Negative for back pain and neck pain.  ?Skin:  Positive for rash. Negative for color change, pallor and wound.  ?Neurological:  Negative for weakness and numbness.  ?Psychiatric/Behavioral:  Negative for confusion.   ? ?Physical Exam ?Updated Vital Signs ?BP (!) 168/110 (BP Location: Left Arm)   Pulse 100   Temp 98 ?F (36.7 ?C) (Oral)   Resp 19   Ht 6' 3"  (1.905 m)   Wt 124.7 kg   SpO2 100%   BMI 34.37 kg/m?  ?Physical Exam ?Vitals and nursing note reviewed.  ?Constitutional:   ?   General: He is not in acute distress. ?   Appearance: He is not ill-appearing, toxic-appearing or diaphoretic.  ?HENT:  ?   Head: Normocephalic.  ?Eyes:  ?   General: No scleral icterus.    ?   Right eye: No discharge.     ?   Left eye: No discharge.  ?Cardiovascular:  ?   Rate and Rhythm: Normal rate.  ?Pulmonary:  ?   Effort: Pulmonary effort is normal.  ?Skin: ?   General:  Skin is warm and dry.  ?   Findings: No petechiae or rash. Rash is not purpuric, urticarial or vesicular.  ?   Comments: Patient has red to violaceous rash with dry scaly lesions excoriations to left before meals and bilateral popliteal areas. ? ?No rashes seen to right upper extremity, or remainder of bilateral lower extremities.  ?Neurological:  ?   General: No focal deficit present.  ?   Mental Status: He is alert.  ?Psychiatric:     ?   Behavior: Behavior is cooperative.  ? ? ? ? ? ? ? ? ?ED Results / Procedures / Treatments   ?Labs ?(all labs ordered are listed, but only abnormal results are displayed) ?Labs Reviewed - No data to display ? ?EKG ?None ? ?Radiology ?No results found. ? ?Procedures ?Procedures  ? ? ?Medications Ordered in ED ?Medications - No data to display ? ?ED Course/  Medical Decision Making/ A&P ?  ?                        ?Medical Decision Making ? ?Alert 66 year old male in no acute distress, nontoxic-appearing.  [facility]resents [facility] complaint of rash. ? ?Information obtained from patient.  Past medical records were reviewed including previous provider notes and labs.  Patient has medical history as outlined in HPI which complicates his care. ? ?Rash as described and photographed above.  Suspect eczema due to characteristics of rash and its location and flexor areas.  We will start patient on hydrocortisone cream.  Patient given information to follow-up with dermatology in the outpatient setting. ? ?Discussed results, findings, treatment and follow up. Patient advised of return precautions. Patient verbalized understanding and agreed with plan. ? ? ? ? ? ? ? ? ? ? ?Final Clinical Impression(s) / ED Diagnoses ?Final diagnoses:  ?None  ? ? ?Rx / DC Orders ?ED Discharge Orders   ? ? None  ? ?  ? ? ?  ?Loni Beckwith, PA-C ?09/08/21 2040 ? ?  ?Loni Beckwith, PA-C ?09/08/21 2040 ? ?  ?Pattricia Boss, MD ?09/08/21 2315 ? ?

## 2021-09-08 NOTE — Discharge Instructions (Addendum)
[facility] the [facility] today to be evaluated for your rash.  Rash appears to be eczema.  Due to this have given you prescription for a steroid cream.  Please apply this to your affected areas 2 times a day.  Do not use this for more than 2 weeks. ? ?Please follow-up with one of the dermatologist listed below as well as your primary care provider for repeat assessment. ? ?Please return to the Emergency Department if you have any new or worsening symptoms. ? ?[facility]: ? ? ?[facility]  ?3.2 (9) ? Dermatologist  ?[place] [facility] [id number]  ?([phone]  ? ?Dr. Michelene Gardener, MD  ?2.6 (18) ? Dermatologist  ?[facility]  ?[phone] ? ?[facility]  ?3.5 (3) ? [facility]  ?[place]  ?[phone]  ? ?[place]  ?4.0 (4) ? Dermatologist  ?[place]  ?[phone] ? ?Lavonna Monarch MD  ?3.0 (2) ? Dermatologist  ?[place]  ?[phone] ? ?Katrina Stack  ?2.7 (6) ? Dermatologist  ?[place]  ?[phone] ? ?[place] Amy Y MD  ?2.0 (1) ? Dermatologist  ?[place]  ?[phone] ? ?[place]  ?5.0 (3) ? Doctor  ?[address]  ?[phone]  ? ? ? ? ? ?

## 2021-09-08 NOTE — ED Triage Notes (Signed)
Patient said he has eczema around his left antecubital and left and right inner thighs. He said "I used everything but it is not clearing up."  ?

## 2021-09-08 NOTE — ED Notes (Signed)
An After Visit Summary was printed and given to the patient. Discharge instructions given and no further questions at this time.  

## 2022-05-14 ENCOUNTER — Other Ambulatory Visit: Payer: Self-pay

## 2022-05-14 ENCOUNTER — Emergency Department (HOSPITAL_COMMUNITY)
Admission: EM | Admit: 2022-05-14 | Discharge: 2022-05-14 | Disposition: A | Discharge status: Home / Self Care | Payer: Medicare Other | Attending: Emergency Medicine | Admitting: Emergency Medicine

## 2022-05-14 DIAGNOSIS — K0889 Other specified disorders of teeth and supporting structures: Secondary | ICD-10-CM | POA: Diagnosis present

## 2022-05-14 DIAGNOSIS — I10 Essential (primary) hypertension: Secondary | ICD-10-CM | POA: Insufficient documentation

## 2022-05-14 DIAGNOSIS — K029 Dental caries, unspecified: Secondary | ICD-10-CM | POA: Insufficient documentation

## 2022-05-14 DIAGNOSIS — Z79899 Other long term (current) drug therapy: Secondary | ICD-10-CM | POA: Diagnosis not present

## 2022-05-14 MED ORDER — NYSTATIN 100000 UNIT/ML MT SUSP: 10.0000 mL | Freq: Three times a day (TID) | OROMUCOSAL | 0 refills | Status: DC | PRN

## 2022-05-14 MED ORDER — CLINDAMYCIN HCL 300 MG PO CAPS: 300.0000 mg | ORAL_CAPSULE | Freq: Three times a day (TID) | ORAL | 0 refills | Status: DC

## 2022-05-14 NOTE — ED Provider Notes (Signed)
[facility] DEPT Provider Note   CSN: [id number] Arrival date & time: 05/14/22  1216     History  Chief Complaint  Patient presents with   Dental Pain    Andrew Wang is a 66 y.o. male with medical history of dental caries, hypertension.  The patient presents to the ED for evaluation of dental caries.  Patient reports that he is currently in the process of having veneers fitted at a local dentistry office however states that due to financial reasons he cannot have this completed for quite some time.  The patient states that over the last 3 days he has had increasing pain to one of his front upper left teeth.  Patient denies any fevers, nausea, vomiting, diarrhea, trouble swallowing.  Patient denies any feelings of facial swelling.  The patient states he is concerned for infection, is requesting antibiotics.  Patient reports that he is currently following up with dentistry however as mentioned before hand, is having financial difficulties.   Dental Pain Associated symptoms: no fever        Home Medications Prior to Admission medications   Medication Sig Start Date End Date Taking? Authorizing Provider  clindamycin (CLEOCIN) 300 MG capsule Take 1 capsule (300 mg total) by mouth 3 (three) times daily. 05/14/22  Yes Azucena Cecil, PA-C  magic mouthwash (nystatin, lidocaine, diphenhydrAMINE, alum & mag hydroxide) suspension Swish and spit 10 mLs 3 (three) times daily as needed for mouth pain. 05/14/22  Yes Azucena Cecil, PA-C  betamethasone valerate ointment (VALISONE) 0.1 % Apply 1 application. topically 2 (two) times daily. 09/08/21   Loni Beckwith, PA-C  Blood Glucose Monitoring Suppl (ONETOUCH VERIO) w/Device KIT 1 Units by Does not apply route in the morning and at bedtime. 11/03/20   Mayers, Cari S, PA-C  glucose blood (ONETOUCH VERIO) test strip 1 each by Other route 2 (two) times daily. 11/03/20   Mayers, Cari S, PA-C  Lancets (ONETOUCH  DELICA PLUS [id number]) MISC 1 Units by Does not apply route in the morning and at bedtime. 11/03/20   Mayers, Cari S, PA-C  lisinopril (ZESTRIL) 20 MG tablet Take 1 tablet (20 mg total) by mouth daily. 11/03/20   Mayers, Cari S, PA-C  metFORMIN (GLUCOPHAGE-XR) 500 MG 24 hr tablet Take 1 tablet (500 mg total) by mouth 2 (two) times daily with a meal. 11/03/20   Mayers, Cari S, PA-C  simvastatin (ZOCOR) 20 MG tablet Take 1 tablet (20 mg total) by mouth at bedtime. 11/03/20   Mayers, Cari S, PA-C  TRUEPLUS LANCETS 28G MISC 1 Stick by Does not apply route 4 (four) times daily. Patient not taking: Reported on 05/04/2017 07/27/16   Brayton Caves, PA-C      Allergies    Penicillins    Review of Systems   Review of Systems  Constitutional:  Negative for fever.  HENT:  Positive for dental problem.   Gastrointestinal:  Negative for diarrhea, nausea and vomiting.  All other systems reviewed and are negative.   Physical Exam Updated Vital Signs BP (!) 158/100 (BP Location: Left Arm)   Pulse (!) 108   Temp 98.4 F (36.9 C) (Oral)   Resp 18   Ht _0  (1.905 m)   Wt 124 kg   SpO2 94%   BMI 34.17 kg/m  Physical Exam Vitals and nursing note reviewed.  Constitutional:      General: He is not in acute distress.    Appearance: He is well-developed.  HENT:     Head: Normocephalic and atraumatic.     Mouth/Throat:     Dentition: Abnormal dentition. Dental tenderness, gingival swelling and dental caries present. No dental abscesses.     Comments: Multiple dental caries, missing teeth throughout.  No obvious abscess in need of drainage.  Patient uvula midline.  Patient handling secretions appropriately. Eyes:     Conjunctiva/sclera: Conjunctivae normal.  Cardiovascular:     Rate and Rhythm: Normal rate and regular rhythm.     Heart sounds: No murmur heard. Pulmonary:     Effort: Pulmonary effort is normal. No respiratory distress.     Breath sounds: Normal breath sounds.  Abdominal:      Palpations: Abdomen is soft.     Tenderness: There is no abdominal tenderness.  Musculoskeletal:        General: No swelling.     Cervical back: Neck supple.  Skin:    General: Skin is warm and dry.     Capillary Refill: Capillary refill takes less than 2 seconds.  Neurological:     Mental Status: He is alert.  Psychiatric:        Mood and Affect: Mood normal.     ED Results / Procedures / Treatments   Labs (all labs ordered are listed, but only abnormal results are displayed) Labs Reviewed - No data to display  EKG None  Radiology No results found.  Procedures Procedures   Medications Ordered in ED Medications - No data to display  ED Course/ Medical Decision Making/ A&P                           Medical Decision Making Risk Prescription drug management.   66 year old male presents to ED for evaluation dental pain.  Please see HPI for further details.  On examination the patient has multiple dental caries throughout.  Patient reports he is currently being followed by dentistry, is in the process of being fitted for veneers.  Patient states that due to financial reasons he is having issues and this has been delayed.  The patient states that he is concerned for infection.  Patient denies fevers, nausea or vomiting, trouble swallowing.  The patient is nontoxic in appearance.  Initially patient was going be placed on Augmentin however patient has penicillin allergy.  Will place the patient on clindamycin 3 times a day for the next 10 days and have him follow-up with dentistry.  Patient also provided with Magic mouthwash.  Patient provided with dental resource guide and advised to follow-up with other dentists if he is having issues with current dentist.  Patient voiced understanding.  All questions answered to satisfaction.  The patient was stable on discharge.  Final Clinical Impression(s) / ED Diagnoses Final diagnoses:  Dental caries    Rx / DC Orders ED  Discharge Orders          Ordered    magic mouthwash (nystatin, lidocaine, diphenhydrAMINE, alum & mag hydroxide) suspension  3 times daily PRN        05/14/22 1322    clindamycin (CLEOCIN) 300 MG capsule  3 times daily        05/14/22 1322              Azucena Cecil, [place] 05/14/22 1334    Tretha Sciara, MD 05/15/22 1808

## 2022-05-14 NOTE — Discharge Instructions (Signed)
Return to ED with any new or worsening signs or symptoms Please follow-up with dentistry.  Please use the attached guide to follow-up with dentist in the area. Please continue taking Tylenol for pain control Please begin using Magic mouthwash Please begin taking antibiotic prescribed.  You will take it 3 times daily for the next 10 days.  Please also purchase probiotic to take with this.

## 2022-05-14 NOTE — ED Triage Notes (Signed)
Pt reports toothache upper front tooth that broke off, swelling to gums. States he can't eat

## 2023-09-25 ENCOUNTER — Observation Stay (HOSPITAL_COMMUNITY)
Admission: EM | Admit: 2023-09-25 | Discharge: 2023-09-26 | Disposition: A | Discharge status: Home / Self Care | Attending: Family Medicine | Admitting: Family Medicine

## 2023-09-25 ENCOUNTER — Emergency Department (HOSPITAL_COMMUNITY)

## 2023-09-25 ENCOUNTER — Encounter (HOSPITAL_COMMUNITY): Payer: Self-pay

## 2023-09-25 ENCOUNTER — Other Ambulatory Visit: Payer: Self-pay

## 2023-09-25 DIAGNOSIS — Z91148 Patient's other noncompliance with medication regimen for other reason: Secondary | ICD-10-CM | POA: Diagnosis not present

## 2023-09-25 DIAGNOSIS — Z7902 Long term (current) use of antithrombotics/antiplatelets: Secondary | ICD-10-CM | POA: Diagnosis not present

## 2023-09-25 DIAGNOSIS — I10 Essential (primary) hypertension: Secondary | ICD-10-CM | POA: Diagnosis not present

## 2023-09-25 DIAGNOSIS — E785 Hyperlipidemia, unspecified: Secondary | ICD-10-CM | POA: Diagnosis not present

## 2023-09-25 DIAGNOSIS — I6322 Cerebral infarction due to unspecified occlusion or stenosis of basilar arteries: Secondary | ICD-10-CM | POA: Diagnosis not present

## 2023-09-25 DIAGNOSIS — R29898 Other symptoms and signs involving the musculoskeletal system: Secondary | ICD-10-CM | POA: Diagnosis not present

## 2023-09-25 DIAGNOSIS — Z7982 Long term (current) use of aspirin: Secondary | ICD-10-CM | POA: Diagnosis not present

## 2023-09-25 DIAGNOSIS — Z79899 Other long term (current) drug therapy: Secondary | ICD-10-CM | POA: Insufficient documentation

## 2023-09-25 DIAGNOSIS — I6381 Other cerebral infarction due to occlusion or stenosis of small artery: Secondary | ICD-10-CM | POA: Diagnosis not present

## 2023-09-25 DIAGNOSIS — E119 Type 2 diabetes mellitus without complications: Secondary | ICD-10-CM | POA: Diagnosis not present

## 2023-09-25 DIAGNOSIS — R7303 Prediabetes: Secondary | ICD-10-CM | POA: Insufficient documentation

## 2023-09-25 DIAGNOSIS — R531 Weakness: Secondary | ICD-10-CM | POA: Diagnosis present

## 2023-09-25 DIAGNOSIS — Z7984 Long term (current) use of oral hypoglycemic drugs: Secondary | ICD-10-CM | POA: Insufficient documentation

## 2023-09-25 DIAGNOSIS — R200 Anesthesia of skin: Secondary | ICD-10-CM | POA: Insufficient documentation

## 2023-09-25 DIAGNOSIS — I639 Cerebral infarction, unspecified: Principal | ICD-10-CM | POA: Insufficient documentation

## 2023-09-25 LAB — I-STAT CHEM 8, ED
BUN: 6 mg/dL — ABNORMAL LOW (ref 8–23)
Calcium, Ion: 1.12 mmol/L — ABNORMAL LOW (ref 1.15–1.40)
Chloride: 100 mmol/L (ref 98–111)
Creatinine, Ser: 1.1 mg/dL (ref 0.61–1.24)
Glucose, Bld: 107 mg/dL — ABNORMAL HIGH (ref 70–99)
HCT: 54 % — ABNORMAL HIGH (ref 39.0–52.0)
Hemoglobin: 18.4 g/dL — ABNORMAL HIGH (ref 13.0–17.0)
Potassium: 3.8 mmol/L (ref 3.5–5.1)
Sodium: 138 mmol/L (ref 135–145)
TCO2: 28 mmol/L (ref 22–32)

## 2023-09-25 LAB — DIFFERENTIAL
Abs Immature Granulocytes: 0.03 10*3/uL (ref 0.00–0.07)
Basophils Absolute: 0.1 10*3/uL (ref 0.0–0.1)
Basophils Relative: 1 %
Eosinophils Absolute: 0.2 10*3/uL (ref 0.0–0.5)
Eosinophils Relative: 2 %
Immature Granulocytes: 0 %
Lymphocytes Relative: 27 %
Lymphs Abs: 3 10*3/uL (ref 0.7–4.0)
Monocytes Absolute: 0.8 10*3/uL (ref 0.1–1.0)
Monocytes Relative: 7 %
Neutro Abs: 7.1 10*3/uL (ref 1.7–7.7)
Neutrophils Relative %: 63 %

## 2023-09-25 LAB — COMPREHENSIVE METABOLIC PANEL WITH GFR
ALT: 41 U/L (ref 0–44)
AST: 29 U/L (ref 15–41)
Albumin: 4.2 g/dL (ref 3.5–5.0)
Alkaline Phosphatase: 88 U/L (ref 38–126)
Anion gap: 12 (ref 5–15)
BUN: 8 mg/dL (ref 8–23)
CO2: 25 mmol/L (ref 22–32)
Calcium: 9.3 mg/dL (ref 8.9–10.3)
Chloride: 100 mmol/L (ref 98–111)
Creatinine, Ser: 0.88 mg/dL (ref 0.61–1.24)
GFR, Estimated: >60 mL/min (ref >60)
Glucose, Bld: 110 mg/dL — ABNORMAL HIGH (ref 70–99)
Potassium: 3.6 mmol/L (ref 3.5–5.1)
Sodium: 137 mmol/L (ref 135–145)
Total Bilirubin: 0.9 mg/dL (ref 0.0–1.2)
Total Protein: 8.4 g/dL — ABNORMAL HIGH (ref 6.5–8.1)

## 2023-09-25 LAB — CBC
HCT: 52.2 % — ABNORMAL HIGH (ref 39.0–52.0)
Hemoglobin: 17.5 g/dL — ABNORMAL HIGH (ref 13.0–17.0)
MCH: 30 pg (ref 26.0–34.0)
MCHC: 33.5 g/dL (ref 30.0–36.0)
MCV: 89.4 fL (ref 80.0–100.0)
Platelets: 224 10*3/uL (ref 150–400)
RBC: 5.84 MIL/uL — ABNORMAL HIGH (ref 4.22–5.81)
RDW: 12.5 % (ref 11.5–15.5)
WBC: 11.2 10*3/uL — ABNORMAL HIGH (ref 4.0–10.5)
nRBC: 0 % (ref 0.0–0.2)

## 2023-09-25 LAB — RAPID URINE DRUG SCREEN, HOSP PERFORMED
Amphetamines: NOT DETECTED
Barbiturates: NOT DETECTED
Benzodiazepines: NOT DETECTED
Cocaine: NOT DETECTED
Opiates: NOT DETECTED
Tetrahydrocannabinol: NOT DETECTED

## 2023-09-25 LAB — CBG MONITORING, ED: Glucose-Capillary: 93 mg/dL (ref 70–99)

## 2023-09-25 LAB — PROTIME-INR
INR: 1 (ref 0.8–1.2)
Prothrombin Time: 13.1 s (ref 11.4–15.2)

## 2023-09-25 LAB — ETHANOL: Alcohol, Ethyl (B): <15 mg/dL (ref <15)

## 2023-09-25 LAB — APTT: aPTT: 26 s (ref 24–36)

## 2023-09-25 MED ORDER — IOHEXOL 350 MG/ML SOLN
75.0000 mL | Freq: Once | INTRAVENOUS | Status: AC | PRN
  Administered 2023-09-25: 75 mL via INTRAVENOUS

## 2023-09-25 MED ORDER — STROKE: EARLY STAGES OF RECOVERY BOOK
Freq: Once | Status: AC
  Filled 2023-09-25 (×2): qty 1

## 2023-09-25 MED ORDER — ACETAMINOPHEN 160 MG/5ML PO SOLN: 650.0000 mg | ORAL | Status: DC | PRN

## 2023-09-25 MED ORDER — ASPIRIN 81 MG PO TBEC
81.0000 mg | DELAYED_RELEASE_TABLET | Freq: Every day | ORAL | Status: DC
  Administered 2023-09-25 – 2023-09-26 (×2): 81 mg via ORAL
  Filled 2023-09-25 (×2): qty 1

## 2023-09-25 MED ORDER — ENOXAPARIN SODIUM 40 MG/0.4ML IJ SOSY
40.0000 mg | PREFILLED_SYRINGE | INTRAMUSCULAR | Status: DC
  Administered 2023-09-25: 40 mg via SUBCUTANEOUS
  Filled 2023-09-25: qty 0.4

## 2023-09-25 MED ORDER — SIMVASTATIN 20 MG PO TABS
20.0000 mg | ORAL_TABLET | Freq: Every day | ORAL | Status: DC
  Administered 2023-09-26: 20 mg via ORAL
  Filled 2023-09-25: qty 1

## 2023-09-25 MED ORDER — LISINOPRIL 10 MG PO TABS
20.0000 mg | ORAL_TABLET | Freq: Every day | ORAL | Status: DC
  Administered 2023-09-26 (×2): 20 mg via ORAL
  Filled 2023-09-25 (×2): qty 2

## 2023-09-25 MED ORDER — CLOPIDOGREL BISULFATE 75 MG PO TABS
75.0000 mg | ORAL_TABLET | Freq: Every day | ORAL | Status: DC
  Administered 2023-09-25 – 2023-09-26 (×2): 75 mg via ORAL
  Filled 2023-09-25 (×2): qty 1

## 2023-09-25 MED ORDER — SODIUM CHLORIDE 0.9 % IV SOLN: INTRAVENOUS | Status: DC

## 2023-09-25 MED ORDER — LABETALOL HCL 5 MG/ML IV SOLN
10.0000 mg | Freq: Once | INTRAVENOUS | Status: AC
  Administered 2023-09-25: 10 mg via INTRAVENOUS
  Filled 2023-09-25: qty 4

## 2023-09-25 MED ORDER — ACETAMINOPHEN 325 MG PO TABS: 650.0000 mg | ORAL_TABLET | ORAL | Status: DC | PRN

## 2023-09-25 MED ORDER — ACETAMINOPHEN 650 MG RE SUPP: 650.0000 mg | RECTAL | Status: DC | PRN

## 2023-09-25 NOTE — ED Triage Notes (Signed)
 Left sided arm and leg weakness with sensation deficit on left side. LKN yesterday at 2300. Pt is hypertensive in triage, has not had lisinopril  in 2 years.

## 2023-09-25 NOTE — ED Provider Notes (Signed)
 [place] EMERGENCY DEPARTMENT AT [facility] Provider Note   CSN: [id number] Arrival date & time: 09/25/23  1950     History  Chief Complaint  Patient presents with   Extremity Weakness    Andrew Wang is a 68 y.o. male with a history of hypertension, former smoker, presented to ED with complaint of left-sided paresthesia.  Patient ports onset of symptoms around 11 PM last night, with numbness of his left face and left hand and arm.  Today noted some numbness of the left leg as well and said he was "walking funny".  He denies blurred vision, headaches.  He reports that he stopped taking all of his medication about 2 to 3 years ago including his blood pressure medicine.  HPI     Home Medications Prior to Admission medications   Medication Sig Start Date End Date Taking? Authorizing Provider  betamethasone  valerate ointment (VALISONE ) 0.1 % Apply 1 application. topically 2 (two) times daily. 09/08/21   Marshal Skeens, PA-C  Blood Glucose Monitoring Suppl (ONETOUCH VERIO) w/Device KIT 1 Units by Does not apply route in the morning and at bedtime. 11/03/20   Mayers, Cari S, PA-C  clindamycin  (CLEOCIN ) 300 MG capsule Take 1 capsule (300 mg total) by mouth 3 (three) times daily. 05/14/22   Adel Aden, PA-C  glucose blood (ONETOUCH VERIO) test strip 1 each by Other route 2 (two) times daily. 11/03/20   Mayers, Cari S, PA-C  Lancets (ONETOUCH DELICA PLUS LANCET33G) MISC 1 Units by Does not apply route in the morning and at bedtime. 11/03/20   Mayers, Cari S, PA-C  lisinopril  (ZESTRIL ) 20 MG tablet Take 1 tablet (20 mg total) by mouth daily. 11/03/20   Mayers, Cari S, PA-C  magic mouthwash (nystatin , lidocaine, diphenhydrAMINE, alum & mag hydroxide) suspension Swish and spit 10 mLs 3 (three) times daily as needed for mouth pain. 05/14/22   Adel Aden, PA-C  metFORMIN  (GLUCOPHAGE -XR) 500 MG 24 hr tablet Take 1 tablet (500 mg total) by mouth 2 (two) times daily with a  meal. 11/03/20   Mayers, Cari S, PA-C  simvastatin  (ZOCOR ) 20 MG tablet Take 1 tablet (20 mg total) by mouth at bedtime. 11/03/20   Mayers, Cari S, PA-C  TRUEPLUS LANCETS 28G MISC 1 Stick by Does not apply route 4 (four) times daily. Patient not taking: Reported on 05/04/2017 07/27/16   Lynne Sat, PA-C      Allergies    Penicillins    Review of Systems   Review of Systems  Physical Exam Updated Vital Signs BP (!) 197/113   Pulse 98   Temp 98.6 F (37 C) (Oral)   Resp 12   Ht 6\' 3"  (1.905 m)   Wt 124 kg   SpO2 100%   BMI 34.17 kg/m  Physical Exam Constitutional:      General: He is not in acute distress. HENT:     Head: Normocephalic and atraumatic.  Eyes:     Conjunctiva/sclera: Conjunctivae normal.     Pupils: Pupils are equal, round, and reactive to light.  Cardiovascular:     Rate and Rhythm: Normal rate and regular rhythm.  Pulmonary:     Effort: Pulmonary effort is normal. No respiratory distress.  Abdominal:     General: There is no distension.     Tenderness: There is no abdominal tenderness.  Skin:    General: Skin is warm and dry.  Neurological:     General: No focal deficit present.  Mental Status: He is alert. Mental status is at baseline.     Comments: Paresthesia the left face, left arm and left leg, strength is symmetrical in the upper and lower extremities, no other evident cranial nerve deficits, speech is clear, patient is fully oriented  Psychiatric:        Mood and Affect: Mood normal.        Behavior: Behavior normal.     ED Results / Procedures / Treatments   Labs (all labs ordered are listed, but only abnormal results are displayed) Labs Reviewed  CBC - Abnormal; Notable for the following components:      Result Value   WBC 11.2 (*)    RBC 5.84 (*)    Hemoglobin 17.5 (*)    HCT 52.2 (*)    All other components within normal limits  COMPREHENSIVE METABOLIC PANEL WITH GFR - Abnormal; Notable for the following components:    Glucose, Bld 110 (*)    Total Protein 8.4 (*)    All other components within normal limits  I-STAT CHEM 8, ED - Abnormal; Notable for the following components:   BUN 6 (*)    Glucose, Bld 107 (*)    Calcium , Ion 1.12 (*)    Hemoglobin 18.4 (*)    HCT 54.0 (*)    All other components within normal limits  ETHANOL  DIFFERENTIAL  RAPID URINE DRUG SCREEN, HOSP PERFORMED  PROTIME-INR  APTT  LIPID PANEL  HEMOGLOBIN A1C  CBG MONITORING, ED    EKG EKG Interpretation Date/Time:  Tuesday September 25 2023 20:42:35 EDT Ventricular Rate:  104 PR Interval:  184 QRS Duration:  87 QT Interval:  338 QTC Calculation: 445 R Axis:   36  Text Interpretation: Sinus tachycardia Ventricular premature complex Low voltage, precordial leads Artifact in lead(s) I II III aVR aVL aVF V4 V5 Confirmed by Jerald Molly [phone]) on 09/25/2023 9:31:16 PM  Radiology CT ANGIO HEAD NECK W WO CM Result Date: 09/25/2023 CLINICAL DATA:  Acute neurologic deficit.  Left-sided weakness. EXAM: CT ANGIOGRAPHY HEAD AND NECK WITH AND WITHOUT CONTRAST TECHNIQUE: Multidetector CT imaging of the head and neck was performed using the standard protocol during bolus administration of intravenous contrast. Multiplanar CT image reconstructions and MIPs were obtained to evaluate the vascular anatomy. Carotid stenosis measurements (when applicable) are obtained utilizing NASCET criteria, using the distal internal carotid diameter as the denominator. RADIATION DOSE REDUCTION: This exam was performed according to the departmental dose-optimization program which includes automated exposure control, adjustment of the mA and/or kV according to patient size and/or use of iterative reconstruction technique. CONTRAST:  75mL OMNIPAQUE  IOHEXOL  350 MG/ML SOLN COMPARISON:  None Available. FINDINGS: CTA NECK FINDINGS Skeleton: No acute abnormality or high grade bony spinal canal stenosis. Other neck: Normal pharynx, larynx and major salivary glands. No  cervical lymphadenopathy. Unremarkable thyroid gland. Upper chest: No pneumothorax or pleural effusion. No nodules or masses. Aortic arch: There is no calcific atherosclerosis of the aortic arch. Conventional 3 vessel aortic branching pattern. RIGHT carotid system: Normal without aneurysm, dissection or stenosis. LEFT carotid system: Normal without aneurysm, dissection or stenosis. Vertebral arteries: Left dominant configuration. There is no dissection, occlusion or flow-limiting stenosis to the skull base (V1-V3 segments). CTA HEAD FINDINGS POSTERIOR CIRCULATION: Vertebral arteries are normal. No proximal occlusion of the anterior or inferior cerebellar arteries. Basilar artery is normal. Superior cerebellar arteries are normal. There is occlusion of the right PCA mid P2 segment. Both PCAs arise from fetal origins. The occluded segment  measures approximately 6 mm in length with distal reconstitution. The left PCA is normal. ANTERIOR CIRCULATION: Intracranial internal carotid arteries are normal. Anterior cerebral arteries are normal. Middle cerebral arteries are normal. Venous sinuses: As permitted by contrast timing, patent. Anatomic variants: None Review of the MIP images confirms the above findings. IMPRESSION: 1. 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 2. No other intracranial arterial occlusion or high-grade stenosis. 3. Normal CTA of the neck. Electronically Signed   By: Juanetta Nordmann M.D.   On: 09/25/2023 23:29   CT HEAD CODE STROKE WO CONTRAST Result Date: 09/25/2023 CLINICAL DATA:  Code stroke.  Left-sided weakness EXAM: CT HEAD WITHOUT CONTRAST TECHNIQUE: Contiguous axial images were obtained from the base of the skull through the vertex without intravenous contrast. RADIATION DOSE REDUCTION: This exam was performed according to the departmental dose-optimization program which includes automated exposure control, adjustment of the mA  and/or kV according to patient size and/or use of iterative reconstruction technique. COMPARISON:  None Available. FINDINGS: Brain: There is no mass, hemorrhage or extra-axial collection. The size and configuration of the ventricles and extra-axial CSF spaces are normal. Focal hypoattenuation of the right thalamus along the posterior limb of the right internal capsule, likely an acute/early subacute infarct. Vascular: No abnormal hyperdensity of the major intracranial arteries or dural venous sinuses. No intracranial atherosclerosis. Skull: The visualized skull base, calvarium and extracranial soft tissues are normal. Sinuses/Orbits: No fluid levels or advanced mucosal thickening of the visualized paranasal sinuses. No mastoid or middle ear effusion. The orbits are normal. ASPECTS [facility] Stroke Program Early CT Score) - Ganglionic level infarction (caudate, lentiform nuclei, internal capsule, insula, M1-M3 cortex): 6 - Supraganglionic infarction (M4-M6 cortex): 3 Total score (0-10 with 10 being normal): 9 IMPRESSION: 1. Focal hypoattenuation of the right thalamus along the posterior limb of the right internal capsule, likely an acute/early subacute infarct. 2. No intracranial hemorrhage. 3. ASPECTS is 9. These results were called by telephone at the time of interpretation on 09/25/2023 at 9:30 pm to provider [facility] , who verbally acknowledged these results. Electronically Signed   By: Juanetta Nordmann M.D.   On: 09/25/2023 21:30    Procedures Procedures    Medications Ordered in ED Medications  aspirin  EC tablet 81 mg (81 mg Oral Given 09/25/23 2231)  clopidogrel  (PLAVIX ) tablet 75 mg (75 mg Oral Given 09/25/23 2231)  lisinopril  (ZESTRIL ) tablet 20 mg (has no administration in time range)  simvastatin  (ZOCOR ) tablet 20 mg (has no administration in time range)   stroke: early stages of recovery book (has no administration in time range)  0.9 %  sodium chloride  infusion ( Intravenous New Bag/Given  09/25/23 2340)  acetaminophen  (TYLENOL ) tablet 650 mg (has no administration in time range)    Or  acetaminophen  (TYLENOL ) 160 MG/5ML solution 650 mg (has no administration in time range)    Or  acetaminophen  (TYLENOL ) suppository 650 mg (has no administration in time range)  enoxaparin  (LOVENOX ) injection 40 mg (40 mg Subcutaneous Given 09/25/23 2340)  labetalol  (NORMODYNE ) injection 10 mg (10 mg Intravenous Given 09/25/23 2125)  iohexol  (OMNIPAQUE ) 350 MG/ML injection 75 mL (75 mLs Intravenous Contrast Given 09/25/23 2251)    ED Course/ Medical Decision Making/ A&P Clinical Course as of 09/25/23 2357  Tue Sep 25, 2023  2122 Acute thalamic stroke per radiology [MT]  2132 Dr Murvin Arthurs neurology ordering imaging, okay for transfer to [facility], permissive Htn to SBP , aspirin  and  plavex to be ordered by the neurologist now [MT]    Clinical Course User Index [MT] Iam Lipson, Janalyn Me, MD                                 Medical Decision Making Amount and/or Complexity of Data Reviewed Labs: ordered. Radiology: ordered.  Risk Prescription drug management. Decision regarding hospitalization.   This patient presents to the ED with concern for left-sided facial numbness and weakness, left arm numbness. This involves an extensive number of treatment options, and is a complaint that carries with it a high risk of complications and morbidity.  The differential diagnosis includes CVA versus metabolic derangement versus other  Co-morbidities that complicate the patient evaluation: Blood pressure  I ordered and personally interpreted labs.  The pertinent results include: No emergent findings  I ordered imaging studies including CT head I independently visualized and interpreted imaging which showed acute lacunar infarct I agree with the radiologist interpretation  The patient was maintained on a cardiac monitor.  I personally viewed and interpreted the cardiac monitored which showed an  underlying rhythm of: Sinus rhythm  Per my interpretation the patient's ECG shows no acute ischemic findings  I ordered medication including labetalol  for high blood pressure  I have reviewed the patients home medicines and have made adjustments as needed  Test Considered: Doubt acute meningitis  I requested consultation with the neurology,  and discussed lab and imaging findings as well as pertinent plan - they recommend: See ED course  After the interventions noted above, I reevaluated the patient and found that they have: stayed the same   Disposition:  After consideration of the diagnostic results and the patients response to treatment, I feel that the patient would benefit from medical admission         Final Clinical Impression(s) / ED Diagnoses Final diagnoses:  Cerebrovascular accident (CVA), unspecified mechanism (HCC)  Hypertension, unspecified type    Rx / DC Orders ED Discharge Orders     None         Arvella Massingale, Janalyn Me, MD 09/25/23 2357

## 2023-09-25 NOTE — H&P (Signed)
 History and Physical    Andrew Wang QVZ:[id number] DOB: 1955-10-09 DOA: 09/25/2023  PCP: Pcp, No   Chief Complaint:  cva  HPI: Andrew Wang is a 68 y.o. male with medical history significant of diabetes, hypertension who presents emergency department due to left-sided weakness.  Patient was having numbness in the left side of his face hand and arm.  He presents emergency department where [place] hypertensive with systolics up to 200.  Labs were obtained on presentation which demonstrated glucose 93, WBC 11.2, hemoglobin 17.5, CMP unrevealing, INR 1.0.  Patient underwent CT head which showed right thalamic stroke.  CTA head and neck is pending.  MRI brain is pending.  Neurology was consulted and recommended transfer to [facility] for further workup.  On evaluation he was endorsing persistent left-sided deficits in conjunction with his imaging findings.  He denied any infectious complaints including fever or chills.  He states he has not been taking his home medications.   Review of Systems: Review of Systems  Constitutional:  Negative for chills and fever.  HENT: Negative.    Eyes: Negative.   Respiratory: Negative.    Cardiovascular: Negative.   Gastrointestinal: Negative.   Genitourinary: Negative.   Musculoskeletal: Negative.   Skin: Negative.   Neurological:  Positive for dizziness, focal weakness and weakness.  Endo/Heme/Allergies: Negative.   Psychiatric/Behavioral: Negative.    All other systems reviewed and are negative.    As per HPI otherwise 10 point review of systems negative.   Allergies  Allergen Reactions   Penicillins Other (See Comments)    Childhood allergy        Past Medical History:  Diagnosis Date   Diabetes (HCC)    Hypertension    Shingles     History reviewed. No pertinent surgical history.   reports that he has never smoked. He has never used smokeless tobacco. He reports that he does not drink alcohol and does not use drugs.  Family History   Problem Relation Age of Onset   Diabetes Mother     Prior to Admission medications   Medication Sig Start Date End Date Taking? Authorizing Provider  betamethasone  valerate ointment (VALISONE ) 0.1 % Apply 1 application. topically 2 (two) times daily. 09/08/21   Marshal Skeens, PA-C  Blood Glucose Monitoring Suppl (ONETOUCH VERIO) w/Device KIT 1 Units by Does not apply route in the morning and at bedtime. 11/03/20   Mayers, Cari S, PA-C  clindamycin  (CLEOCIN ) 300 MG capsule Take 1 capsule (300 mg total) by mouth 3 (three) times daily. 05/14/22   Adel Aden, PA-C  glucose blood (ONETOUCH VERIO) test strip 1 each by Other route 2 (two) times daily. 11/03/20   Mayers, Cari S, PA-C  Lancets (ONETOUCH DELICA PLUS LANCET33G) MISC 1 Units by Does not apply route in the morning and at bedtime. 11/03/20   Mayers, Cari S, PA-C  lisinopril  (ZESTRIL ) 20 MG tablet Take 1 tablet (20 mg total) by mouth daily. 11/03/20   Mayers, Cari S, PA-C  magic mouthwash (nystatin , lidocaine, diphenhydrAMINE, alum & mag hydroxide) suspension Swish and spit 10 mLs 3 (three) times daily as needed for mouth pain. 05/14/22   Adel Aden, PA-C  metFORMIN  (GLUCOPHAGE -XR) 500 MG 24 hr tablet Take 1 tablet (500 mg total) by mouth 2 (two) times daily with a meal. 11/03/20   Mayers, Cari S, PA-C  simvastatin  (ZOCOR ) 20 MG tablet Take 1 tablet (20 mg total) by mouth at bedtime. 11/03/20   Mayers, Etter Hermann, PA-C  TRUEPLUS LANCETS 28G MISC 1 Stick by Does not apply route 4 (four) times daily. Patient not taking: Reported on 05/04/2017 07/27/16   Lynne Sat, PA-C    Physical Exam: Vitals:   09/25/23 2004 09/25/23 2005 09/25/23 2115  BP:  (!) 205/137 (!) 197/113  Pulse:  (!) 102 98  Resp:  17 12  Temp:  98.6 F (37 C)   TempSrc:  Oral   SpO2:  98% 100%  Weight: 124 kg    Height: 6\' 3"  (1.905 m)     Physical Exam Vitals reviewed.  Constitutional:      Appearance: He is normal weight.  HENT:     Head:  Normocephalic.     Nose: Nose normal.     Mouth/Throat:     Mouth: Mucous membranes are moist.     Pharynx: Oropharynx is clear.  Eyes:     Conjunctiva/sclera: Conjunctivae normal.     Pupils: Pupils are equal, round, and reactive to light.  Cardiovascular:     Rate and Rhythm: Normal rate and regular rhythm.     Pulses: Normal pulses.     Heart sounds: Normal heart sounds.  Pulmonary:     Effort: Pulmonary effort is normal.     Breath sounds: Normal breath sounds.  Abdominal:     General: Abdomen is flat. Bowel sounds are normal.  Musculoskeletal:        General: Normal range of motion.     Cervical back: Normal range of motion.  Skin:    General: Skin is warm.     Capillary Refill: Capillary refill takes less than 2 seconds.  Neurological:     Mental Status: He is alert and oriented to person, place, and time.     Cranial Nerves: Cranial nerve deficit present.     Sensory: Sensory deficit present.     Motor: Weakness present.  Psychiatric:        Mood and Affect: Mood normal.        Behavior: Behavior normal.       Labs on Admission: I have personally reviewed the patients's labs and imaging studies.  Assessment/Plan Principal Problem:   CVA (cerebral vascular accident) [facility])   # CVA - Patient presented with left-sided deficits and right thalamic stroke on CT - Neurology consulted  Plan: Transfer to [facility] Neurology consultation Therapy evaluation Echocardiogram Obtain MRI brain Monitor on telemetry Start aspirin  Plavix   # Hypertension permissive hypertension for systolics in the 200s.  Will plan to resume home lisinopril   # Hyperlipidemia-start simvastatin     Admission status: Inpatient Telemetry Medical  Certification: The appropriate patient status for this patient is INPATIENT. Inpatient status is judged to be reasonable and necessary in order to provide the required intensity of service to ensure the patient's safety. The patient's presenting  symptoms, physical exam findings, and initial radiographic and laboratory data in the context of their chronic comorbidities is felt to place them at high risk for further clinical deterioration. Furthermore, it is not anticipated that the patient will be medically stable for discharge from the hospital within 2 midnights of admission.   * I certify that at the point of admission it is my clinical judgment that the patient will require inpatient hospital care spanning beyond 2 midnights from the point of admission due to high intensity of service, high risk for further deterioration and high frequency of surveillance required.Myrl Askew MD [facility] If 7PM-7AM, please contact night-coverage [email or web address]  09/25/2023,  10:51 PM

## 2023-09-25 NOTE — ED Notes (Signed)
 Patient transported to CT

## 2023-09-26 ENCOUNTER — Other Ambulatory Visit (HOSPITAL_COMMUNITY): Payer: Self-pay

## 2023-09-26 ENCOUNTER — Inpatient Hospital Stay (HOSPITAL_COMMUNITY)

## 2023-09-26 ENCOUNTER — Observation Stay (HOSPITAL_BASED_OUTPATIENT_CLINIC_OR_DEPARTMENT_OTHER)

## 2023-09-26 DIAGNOSIS — Z794 Long term (current) use of insulin: Secondary | ICD-10-CM

## 2023-09-26 DIAGNOSIS — E785 Hyperlipidemia, unspecified: Secondary | ICD-10-CM

## 2023-09-26 DIAGNOSIS — I6389 Other cerebral infarction: Secondary | ICD-10-CM | POA: Diagnosis not present

## 2023-09-26 DIAGNOSIS — Z7984 Long term (current) use of oral hypoglycemic drugs: Secondary | ICD-10-CM

## 2023-09-26 DIAGNOSIS — I10 Essential (primary) hypertension: Secondary | ICD-10-CM | POA: Diagnosis not present

## 2023-09-26 DIAGNOSIS — I6381 Other cerebral infarction due to occlusion or stenosis of small artery: Secondary | ICD-10-CM

## 2023-09-26 DIAGNOSIS — E1151 Type 2 diabetes mellitus with diabetic peripheral angiopathy without gangrene: Secondary | ICD-10-CM | POA: Diagnosis not present

## 2023-09-26 DIAGNOSIS — R7303 Prediabetes: Secondary | ICD-10-CM | POA: Diagnosis not present

## 2023-09-26 DIAGNOSIS — I6621 Occlusion and stenosis of right posterior cerebral artery: Secondary | ICD-10-CM

## 2023-09-26 DIAGNOSIS — I639 Cerebral infarction, unspecified: Secondary | ICD-10-CM | POA: Diagnosis present

## 2023-09-26 DIAGNOSIS — R29702 NIHSS score 2: Secondary | ICD-10-CM

## 2023-09-26 DIAGNOSIS — I6322 Cerebral infarction due to unspecified occlusion or stenosis of basilar arteries: Secondary | ICD-10-CM | POA: Diagnosis not present

## 2023-09-26 LAB — LIPID PANEL
Cholesterol: 180 mg/dL (ref 0–200)
HDL: 37 mg/dL — ABNORMAL LOW (ref >40)
LDL Cholesterol: 118 mg/dL — ABNORMAL HIGH (ref 0–99)
Total CHOL/HDL Ratio: 4.9 ratio
Triglycerides: 123 mg/dL (ref <150)
VLDL: 25 mg/dL (ref 0–40)

## 2023-09-26 LAB — ECHOCARDIOGRAM COMPLETE
Area-P 1/2: 3.6 cm2
Calc EF: 60.7 %
Height: 75 in
S' Lateral: 3.7 cm
Single Plane A2C EF: 66.5 %
Single Plane A4C EF: 55.9 %
Weight: 4373.93 [oz_av]

## 2023-09-26 LAB — HEMOGLOBIN A1C
Hgb A1c MFr Bld: 6.1 % — ABNORMAL HIGH (ref 4.8–5.6)
Mean Plasma Glucose: 128.37 mg/dL

## 2023-09-26 MED ORDER — CLOPIDOGREL BISULFATE 75 MG PO TABS
75.0000 mg | ORAL_TABLET | Freq: Every day | ORAL | 0 refills | Status: DC
  Filled 2023-09-26: qty 90, 90d supply, fill #0

## 2023-09-26 MED ORDER — ASPIRIN 81 MG PO TBEC: 81.0000 mg | DELAYED_RELEASE_TABLET | Freq: Every day | ORAL | Status: DC

## 2023-09-26 MED ORDER — ATORVASTATIN CALCIUM 40 MG PO TABS
80.0000 mg | ORAL_TABLET | Freq: Every day | ORAL | Status: DC
  Administered 2023-09-26: 80 mg via ORAL
  Filled 2023-09-26: qty 2

## 2023-09-26 MED ORDER — LISINOPRIL 20 MG PO TABS
20.0000 mg | ORAL_TABLET | Freq: Every day | ORAL | 0 refills | Status: DC
  Filled 2023-09-26: qty 90, 90d supply, fill #0
  Filled 2023-12-26: qty 3, 3d supply, fill #1

## 2023-09-26 MED ORDER — ATORVASTATIN CALCIUM 80 MG PO TABS
80.0000 mg | ORAL_TABLET | Freq: Every day | ORAL | 0 refills | Status: DC
  Filled 2023-09-26: qty 90, 90d supply, fill #0

## 2023-09-26 NOTE — ED Notes (Signed)
 Pt off the floor to MRI via wheelchair.  Pt is a&o x 4 and in no obvious distress at this time.

## 2023-09-26 NOTE — Consult Note (Signed)
 NEUROLOGY CONSULT NOTE   Date of service: September 26, 2023 Patient Name: Andrew Wang MRN:  [id number] DOB:  10-26-1955 Chief Complaint: "left sided numbness" Requesting Provider: Lonita Roach, MD  History of Present Illness  Andrew Wang is a 68 y.o. male with hx of diabetes, hypertension who presents emergency department due to left-sided weakness. Patient was having numbness in the left side of his face hand and arm.  MRI brain shows an acute lacunar infarct in his right thalamus and CTA of his head and neck shows a right PCA P2 occlusion.  It does not appear that he has been compliant with his statin or his blood pressure medications.  LDL is 118 and hemoglobin W0J is 6.1.  Systolic blood pressure has been in the 150s-160s since his arrival to the emergency department.  Echocardiogram with LVEF 60 to 65%.  LKW: 4/21 @ 2300  NIHSS components Score: Comment  1a Level of Conscious 0[]  1[]  2[]  3[]      1b LOC Questions 0[]  1[]  2[]       1c LOC Commands 0[]  1[]  2[]       2 Best Gaze 0[]  1[]  2[]       3 Visual 0[]  1[]  2[]  3[]      4 Facial Palsy 0[]  1[x]  2[]  3[]      5a Motor Arm - left 0[]  1[]  2[]  3[]  4[]  UN[]    5b Motor Arm - Right 0[]  1[]  2[]  3[]  4[]  UN[]    6a Motor Leg - Left 0[]  1[]  2[]  3[]  4[]  UN[]    6b Motor Leg - Right 0[]  1[]  2[]  3[]  4[]  UN[]    7 Limb Ataxia 0[]  1[]  2[]  3[]  UN[]     8 Sensory 0[]  1[x]  2[]  UN[]      9 Best Language 0[]  1[]  2[]  3[]      10 Dysarthria 0[]  1[]  2[]  UN[]      11 Extinct. and Inattention 0[]  1[]  2[]       TOTAL:2       ROS  Comprehensive ROS performed and pertinent positives documented in HPI   Past History   Past Medical History:  Diagnosis Date   Diabetes (HCC)    Hypertension    Shingles     History reviewed. No pertinent surgical history.  Family History: Family History  Problem Relation Age of Onset   Diabetes Mother     Social History  reports that he has never smoked. He has never used smokeless tobacco. He reports that he does not  drink alcohol and does not use drugs.  Allergies  Allergen Reactions   Penicillins Anaphylaxis    Childhood allergy        Medications   Current Facility-Administered Medications:    acetaminophen  (TYLENOL ) tablet 650 mg, 650 mg, Oral, Q4H PRN **OR** acetaminophen  (TYLENOL ) 160 MG/5ML solution 650 mg, 650 mg, Per Tube, Q4H PRN **OR** acetaminophen  (TYLENOL ) suppository 650 mg, 650 mg, Rectal, Q4H PRN, Dorrell, Robert, MD   aspirin  EC tablet 81 mg, 81 mg, Oral, Daily, Dorrell, Robert, MD, 81 mg at 09/26/23 1018   atorvastatin  (LIPITOR) tablet 80 mg, 80 mg, Oral, Daily, Lonita Roach, MD, 80 mg at 09/26/23 1018   clopidogrel  (PLAVIX ) tablet 75 mg, 75 mg, Oral, Daily, Dorrell, Robert, MD, 75 mg at 09/26/23 1018   enoxaparin  (LOVENOX ) injection 40 mg, 40 mg, Subcutaneous, Q24H, Dorrell, Robert, MD, 40 mg at 09/25/23 2340   lisinopril  (ZESTRIL ) tablet 20 mg, 20 mg, Oral, Daily, Dorrell, Robert, MD, 20 mg at 09/26/23 1018  Current Outpatient Medications:  metFORMIN  (GLUCOPHAGE -XR) 500 MG 24 hr tablet, Take 1 tablet (500 mg total) by mouth 2 (two) times daily with a meal., Disp: 60 tablet, Rfl: 1   betamethasone  valerate ointment (VALISONE ) 0.1 %, Apply 1 application. topically 2 (two) times daily. (Patient not taking: Reported on 09/26/2023), Disp: 30 g, Rfl: 0   Blood Glucose Monitoring Suppl (ONETOUCH VERIO) w/Device KIT, 1 Units by Does not apply route in the morning and at bedtime. (Patient not taking: Reported on 09/26/2023), Disp: 1 kit, Rfl: 0   clindamycin  (CLEOCIN ) 300 MG capsule, Take 1 capsule (300 mg total) by mouth 3 (three) times daily. (Patient not taking: Reported on 09/26/2023), Disp: 30 capsule, Rfl: 0   glucose blood (ONETOUCH VERIO) test strip, 1 each by Other route 2 (two) times daily. (Patient not taking: Reported on 09/26/2023), Disp: 100 each, Rfl: 12   Lancets (ONETOUCH DELICA PLUS LANCET33G) MISC, 1 Units by Does not apply route in the morning and at bedtime.  (Patient not taking: Reported on 09/26/2023), Disp: 100 each, Rfl: 12   lisinopril  (ZESTRIL ) 20 MG tablet, Take 1 tablet (20 mg total) by mouth daily. (Patient not taking: Reported on 09/26/2023), Disp: 30 tablet, Rfl: 1   magic mouthwash (nystatin , lidocaine, diphenhydrAMINE, alum & mag hydroxide) suspension, Swish and spit 10 mLs 3 (three) times daily as needed for mouth pain. (Patient not taking: Reported on 09/26/2023), Disp: 180 mL, Rfl: 0   simvastatin  (ZOCOR ) 20 MG tablet, Take 1 tablet (20 mg total) by mouth at bedtime. (Patient not taking: Reported on 09/26/2023), Disp: 30 tablet, Rfl: 1   TRUEPLUS LANCETS 28G MISC, 1 Stick by Does not apply route 4 (four) times daily. (Patient not taking: Reported on 05/04/2017), Disp: 120 each, Rfl: 1  Vitals   Vitals:   09/26/23 0030 09/26/23 0330 09/26/23 0425 09/26/23 0630  BP: (!) 154/90 (!) 162/91  (!) 157/89  Pulse: 82 67  63  Resp: (!) 21 17  19   Temp:   97.6 F (36.4 C)   TempSrc:   Oral   SpO2: 96% 96%  96%  Weight:      Height:        Body mass index is 34.17 kg/m.  Physical Exam   Constitutional: Appears well-developed and well-nourished.  Psych: Affect appropriate to situation.  Eyes: No scleral injection.  HENT: No OP obstruction.  Head: Normocephalic.  Cardiovascular: Normal rate and regular rhythm.  Respiratory: Effort normal, non-labored breathing.  GI: Soft.  No distension. There is no tenderness.  Skin: WDI.   Neurologic Examination   Neuro: Mental Status: Patient is awake, alert, oriented to person, place, month, year, and situation. Patient is able to give a clear and coherent history. No signs of aphasia or neglect Cranial Nerves: II: Visual Fields are full. Pupils are equal, round, and reactive to light.   III,IV, VI: EOMI without ptosis or diploplia.  V: Facial sensation is symmetric to temperature VII: Facial movement is symmetric resting and smiling VIII: Hearing is intact to voice X: Palate elevates  symmetrically XI: Shoulder shrug is symmetric. XII: Tongue protrudes midline without atrophy or fasciculations.  Motor: Tone is normal. Bulk is normal. 5/5 strength was present in all four extremities.  Sensory: Sensation diminished in left hand and top and bottom of foot Cerebellar: FNF and HKS are intact bilaterally        Labs/Imaging/Neurodiagnostic studies   CBC:  Recent Labs  Lab 14-Oct-2023 2011 10/14/2023 2035  WBC 11.2*  --   NEUTROABS 7.1  --  HGB 17.5* 18.4*  HCT 52.2* 54.0*  MCV 89.4  --   PLT 224  --    Basic Metabolic Panel:  Lab Results  Component Value Date   NA 138 09/25/2023   K 3.8 09/25/2023   CO2 25 09/25/2023   GLUCOSE 107 (H) 09/25/2023   BUN 6 (L) 09/25/2023   CREATININE 1.10 09/25/2023   CALCIUM  9.3 09/25/2023   GFRNONAA >60 09/25/2023   GFRAA 80 05/04/2017   Lipid Panel:  Lab Results  Component Value Date   LDLCALC 118 (H) 09/26/2023   HgbA1c:  Lab Results  Component Value Date   HGBA1C 6.1 (H) 09/25/2023   Urine Drug Screen:     Component Value Date/Time   LABOPIA NONE DETECTED 09/25/2023 2235   COCAINSCRNUR NONE DETECTED 09/25/2023 2235   LABBENZ NONE DETECTED 09/25/2023 2235   AMPHETMU NONE DETECTED 09/25/2023 2235   THCU NONE DETECTED 09/25/2023 2235   LABBARB NONE DETECTED 09/25/2023 2235    Alcohol Level     Component Value Date/Time   [facility] <15 09/25/2023 2011   INR  Lab Results  Component Value Date   INR 1.0 09/25/2023   APTT  Lab Results  Component Value Date   APTT 26 09/25/2023   AED levels: No results found for: "PHENYTOIN", "ZONISAMIDE", "LAMOTRIGINE", "LEVETIRACETA"  CT Head without contrast(Personally reviewed): 1. Focal hypoattenuation of the right thalamus along the posterior limb of the right internal capsule, likely an acute/early subacute infarct. 2. No intracranial hemorrhage. 3. ASPECTS is 9.  CT angio Head and Neck with contrast(Personally reviewed): Occlusion of the right PCA mid P2  segment measuring approximately 6 mm in length with distal reconstitution.  MRI Brain(Personally reviewed): Acute lacunar infarct in the right thalamus. Moderate chronic white matter disease.  Echocardiogram: EF 60-65% no intracardiac clot  ASSESSMENT   Jaidon Ellery is a 68 y.o. male presenting with an acute lacunar infarct in his right thalamus.  Etiology favored to be small vessel disease. His CT angio of his head and neck shows an occlusion of the right PCA mid P2 segment with distal reconstitution.  He is now on aspirin  and Plavix . They have also resumed his home lisinopril .   RECOMMENDATIONS  - Follow up with [facility] outpatient - Follow up with PCP for continued management of risk factors.  - DAPT with aspirin  81 mg and Plavix  75 mg for 3 months and then aspirin  81 mg alone - HLD: Atorvastatin  80mg  dialy - HTN: Lisinopril  20mg  daily  - DM2: Continue metformin  at discharge. SSI and ACHS CBGs while inpatient ______________________________________________________________________    Signed, Imogene Mana, NP [facility]   Attending Neurohospitalist Addendum Patient seen and examined with APP/Resident. Agree with the history and physical as documented above. Agree with the plan as documented, which I helped formulate. I have edited the note above to reflect my full findings and recommendations. I have independently reviewed the chart, obtained history, review of systems and examined the patient.I have personally reviewed pertinent head/neck/spine imaging (CT/MRI). Please feel free to call with any questions.  -- Andrew Leaks, MD [facility] [phone]  If 7pm- 7am, please page neurology on call as listed in AMION.

## 2023-09-26 NOTE — Progress Notes (Signed)
  Progress Note   Patient: Andrew Wang ZOX:[id number] DOB: 1956-04-06 DOA: 09/25/2023     1 DOS: the patient was seen and examined on 09/26/2023   Brief hospital course: 68 year old man PMH including diabetes, hypertension not on medications secondary to noncompliance who presented with left-sided numbness.  Admitted for acute stroke.   Consultants Neurology   Procedures/Events    Assessment and Plan: Acute right thalamic infarct with associated numbness left face, hand, lower leg and foot Permissive hypertension.  MRI confirmed thalamic infarct.  CTA head and neck noted. LDL 118, hemoglobin A1c 6.1.  Start statin. 2D echocardiogram Follow-up neurology recommendations  Essential hypertension Noncompliant with medications. Permissive hypertension.  Restart antihypertensives on discharge.  Prediabetes Hemoglobin A1c 6.1 Dietitian consultation Outpatient follow-up for lifestyle modification     Subjective:  Feels better Some numbness left hand, left foot and lower leg, left side of the face.  Speaking and swallowing fine.  Physical Exam: Vitals:   09/26/23 0030 09/26/23 0330 09/26/23 0425 09/26/23 0630  BP: (!) 154/90 (!) 162/91  (!) 157/89  Pulse: 82 67  63  Resp: (!) 21 17  19   Temp:   97.6 F (36.4 C)   TempSrc:   Oral   SpO2: 96% 96%  96%  Weight:      Height:       Physical Exam Vitals reviewed.  Constitutional:      General: He is not in acute distress.    Appearance: He is not ill-appearing or toxic-appearing.  Cardiovascular:     Rate and Rhythm: Normal rate and regular rhythm.     Heart sounds: No murmur heard. Pulmonary:     Effort: Pulmonary effort is normal. No respiratory distress.     Breath sounds: No wheezing, rhonchi or rales.  Musculoskeletal:     Right lower leg: No edema.     Left lower leg: No edema.  Neurological:     Mental Status: He is alert.     Motor: No weakness.     Coordination: Coordination abnormal (LUE).  Psychiatric:         Mood and Affect: Mood normal.        Behavior: Behavior normal.     Data Reviewed: Basic metabolic panel unremarkable LDL 118 Hemoglobin 17.5, consistent with previous values WBC modestly elevated 11.2 Hemoglobin A1c 6.1 Urine drug screen negative CTA head and neck neck last evening showed occlusion right PCA measuring 6 mm MRI brain acute lacunar infarct right thalamus EKG independent interpretation/tachycardia no acute changes  Family Communication: none  Disposition: Status is: Inpatient Remains inpatient appropriate because: stroke  Planned Discharge Destination: home    Time spent: 35 minutes  Author: Jerline Moon, MD 09/26/2023 9:06 AM  For on call review [email or web address].

## 2023-09-26 NOTE — Progress Notes (Signed)
 OT Cancellation Note  Patient Details Name: Andrew Wang MRN: [id number] DOB: 1955-11-04   Cancelled Treatment:    Reason Eval/Treat Not Completed: Medical issues which prohibited therapy Patient is pending further testing and transfer to [name] at this time. OT to continue to follow and check back after transfer as schedule will allow.  Wynette Heckler, MS Acute Rehabilitation Department Office# [phone]  09/26/2023, 7:12 AM

## 2023-09-26 NOTE — Care Management Obs Status (Signed)
 MEDICARE OBSERVATION STATUS NOTIFICATION   Patient Details  Name: Andrew Wang MRN: [id number] Date of Birth: 11/12/1955   Medicare Observation Status Notification Given:  Yes    Jabier Martens, LCSW 09/26/2023, 10:41 AM

## 2023-09-26 NOTE — Care Management CC44 (Signed)
 Condition Code 44 Documentation Completed  Patient Details  Name: Andrew Wang MRN: [id number] Date of Birth: [birth year]   Condition Code 44 given:  Yes Patient signature on Condition Code 44 notice:  Yes Documentation of 2 MD's agreement:  Yes Code 44 added to claim:  Yes    Jabier Martens, LCSW 09/26/2023, 10:41 AM

## 2023-09-26 NOTE — Progress Notes (Signed)
  Echocardiogram 2D Echocardiogram has been performed.  Royden Corin 09/26/2023, 3:17 PM

## 2023-09-26 NOTE — ED Notes (Signed)
 Provided pt with [place] sausage/egg white sandwich

## 2023-09-26 NOTE — Progress Notes (Signed)
 PT Cancellation Note  Patient Details Name: Less Woolsey MRN: [id number] DOB: 1956/03/15   Cancelled Treatment:    Reason Eval/Treat Not Completed: Patient not medically ready PT order received, note request to transfer to [facility], further testing pending. Will follow up at another time . Abelina Hoes PT Acute Rehabilitation Services Office [phone] Weekend [phone]    Dareen Ebbing 09/26/2023, 7:02 AM

## 2023-09-26 NOTE — Discharge Summary (Signed)
 Physician Discharge Summary   Patient: Andrew Wang MRN: [id number] DOB: February 13, 1956  Admit date:     09/25/2023  Discharge date: 09/26/23  Discharge Physician: Jerline Moon   PCP: Pcp, No   Recommendations at discharge:   Follow-up stroke  Discharge Diagnoses: Principal Problem:   CVA (cerebral vascular accident) [facility]) Active Problems:   Essential hypertension   Prediabetes   Acute CVA (cerebrovascular accident) (HCC)  Resolved Problems:   * No resolved hospital problems. *  Hospital Course: 68 year old man PMH including diabetes, hypertension not on medications secondary to noncompliance who presented with left-sided numbness.  Admitted for acute stroke.  Hospitalization uncomplicated.  Discharged home in good condition.  Consultants Neurology   Procedures/Events  None  Acute right thalamic infarct with associated numbness left face, hand, lower leg and foot Permissive hypertension.  MRI confirmed thalamic infarct.  CTA head and neck noted. LDL 118, hemoglobin A1c 6.1.  Started statin. 2D echocardiogram unremarkable Discussed with neurology, plan aspirin  and Plavix  for 3 months then aspirin  alone.  Follow-up with neurology as an outpatient. Patient swallowing fine, speaking well, no need for speech therapy evaluation or OT evaluation.   Essential hypertension Noncompliant with medications. Permissive hypertension.  Restart antihypertensive on discharge.   Diabetes mellitus type 2 Hemoglobin A1c 6.1 Dietitian consultation Outpatient follow-up for lifestyle modification, continue metformin   Disposition: Home Diet recommendation:  Cardiac and Carb modified diet DISCHARGE MEDICATION: Allergies as of 09/26/2023       Reactions   Penicillins Anaphylaxis   Childhood allergy         Medication List     STOP taking these medications    betamethasone  valerate ointment 0.1 % Commonly known as: VALISONE    clindamycin  300 MG capsule Commonly known as:  Cleocin    magic mouthwash (nystatin , lidocaine, diphenhydrAMINE, alum & mag hydroxide) suspension   OneTouch Delica Plus Lancet33G Misc   OneTouch Verio test strip Generic drug: glucose blood   OneTouch Verio w/Device Kit   simvastatin  20 MG tablet Commonly known as: ZOCOR    TRUEplus Lancets 28G Misc       TAKE these medications    aspirin  EC 81 MG tablet Take 1 tablet (81 mg total) by mouth daily. Swallow whole. Start taking on: September 27, 2023   atorvastatin  80 MG tablet Commonly known as: LIPITOR Take 1 tablet (80 mg total) by mouth daily. Start taking on: September 27, 2023   clopidogrel  75 MG tablet Commonly known as: PLAVIX  Take 1 tablet (75 mg total) by mouth daily. Start taking on: September 27, 2023   lisinopril  20 MG tablet Commonly known as: ZESTRIL  Take 1 tablet (20 mg total) by mouth daily.   metFORMIN  500 MG 24 hr tablet Commonly known as: GLUCOPHAGE -XR Take 1 tablet (500 mg total) by mouth 2 (two) times daily with a meal.        Follow-up Information     [facility] Follow up.   Why: Office will call you with an appointment Contact information: [address]     [address] [place]  [address] [phone]               Discharge Exam: Andrew Wang Weights   09/25/23 2004  Weight: 124 kg   See exam same day  Condition at discharge: good  The results of significant diagnostics from this hospitalization (including imaging, microbiology, ancillary and laboratory) are listed below for reference.   Imaging Studies: ECHOCARDIOGRAM COMPLETE Result Date: 09/26/2023    ECHOCARDIOGRAM REPORT  Patient Name:   Andrew Wang Date of Exam: 09/26/2023 Medical Rec #:  [id number]   Height:       75.0 in Accession #:    [id number]  Weight:       273.4 lb Date of Birth:  05-27-1956    BSA:          2.506 m Patient Age:    68 years    BP:           157/81 mmHg Patient Gender: M           HR:           82 bpm. Exam Location:   Inpatient Procedure: 2D Echo, Cardiac Doppler and Color Doppler (Both Spectral and Color            Flow Doppler were utilized during procedure). Indications:    Stroke  History:        Patient has no prior history of Echocardiogram examinations.                 Stroke; Risk Factors:Hypertension, Diabetes and Dyslipidemia.  Sonographer:    Raynelle Callow RDCS Referring Phys: [id number] [facility]  Sonographer Comments: Technically difficult study due to poor echo windows. Image acquisition challenging due to respiratory motion. IMPRESSIONS  1. Left ventricular ejection fraction, by estimation, is 60 to 65%. The left ventricle has normal function. The left ventricle has no regional wall motion abnormalities. There is mild left ventricular hypertrophy of the basal-septal segment. Left ventricular diastolic parameters are indeterminate.  2. Right ventricular systolic function is normal. The right ventricular size is normal. Tricuspid regurgitation signal is inadequate for assessing PA pressure.  3. The mitral valve is normal in structure. No evidence of mitral valve regurgitation. No evidence of mitral stenosis.  4. The aortic valve is calcified. There is moderate calcification of the aortic valve. There is moderate thickening of the aortic valve. Aortic valve regurgitation is not visualized. Aortic valve sclerosis/calcification is present, without any evidence of aortic stenosis. Conclusion(s)/Recommendation(s): No intracardiac source of embolism detected on this transthoracic study. Consider a transesophageal echocardiogram to exclude cardiac source of embolism if clinically indicated. FINDINGS  Left Ventricle: Left ventricular ejection fraction, by estimation, is 60 to 65%. The left ventricle has normal function. The left ventricle has no regional wall motion abnormalities. The left ventricular internal cavity size was normal in size. There is  mild left ventricular hypertrophy of the basal-septal segment. Left  ventricular diastolic parameters are indeterminate. Right Ventricle: The right ventricular size is normal. No increase in right ventricular wall thickness. Right ventricular systolic function is normal. Tricuspid regurgitation signal is inadequate for assessing PA pressure. Left Atrium: Left atrial size was normal in size. Right Atrium: Right atrial size was normal in size. Pericardium: There is no evidence of pericardial effusion. Mitral Valve: The mitral valve is normal in structure. No evidence of mitral valve regurgitation. No evidence of mitral valve stenosis. Tricuspid Valve: The tricuspid valve is normal in structure. Tricuspid valve regurgitation is not demonstrated. No evidence of tricuspid stenosis. Aortic Valve: The aortic valve is calcified. There is moderate calcification of the aortic valve. There is moderate thickening of the aortic valve. Aortic valve regurgitation is not visualized. Aortic valve sclerosis/calcification is present, without any  evidence of aortic stenosis. Pulmonic Valve: The pulmonic valve was normal in structure. Pulmonic valve regurgitation is not visualized. No evidence of pulmonic stenosis. Aorta: The aortic root is normal in size and structure. Venous: The inferior vena  cava was not well visualized. IAS/Shunts: No atrial level shunt detected by color flow Doppler.  LEFT VENTRICLE PLAX 2D LVIDd:         5.10 cm     Diastology LVIDs:         3.70 cm     LV e' medial:    4.03 cm/s LV PW:         1.10 cm     LV E/e' medial:  18.7 LV IVS:        1.20 cm     LV e' lateral:   10.90 cm/s LVOT diam:     2.40 cm     LV E/e' lateral: 6.9 LV SV:         85 LV SV Index:   34 LVOT Area:     4.52 cm  LV Volumes (MOD) LV vol d, MOD A2C: 85.1 ml LV vol d, MOD A4C: 88.4 ml LV vol s, MOD A2C: 28.5 ml LV vol s, MOD A4C: 39.0 ml LV SV MOD A2C:     56.6 ml LV SV MOD A4C:     88.4 ml LV SV MOD BP:      54.1 ml RIGHT VENTRICLE            IVC RV S prime:     7.51 cm/s  IVC diam: 2.20 cm TAPSE  (M-mode): 1.8 cm LEFT ATRIUM             Index        RIGHT ATRIUM           Index LA diam:        3.10 cm 1.24 cm/m   RA Area:     10.10 cm LA Vol (A2C):   28.1 ml 11.21 ml/m  RA Volume:   17.10 ml  6.82 ml/m LA Vol (A4C):   35.3 ml 14.09 ml/m LA Biplane Vol: 34.3 ml 13.69 ml/m  AORTIC VALVE LVOT Vmax:   90.60 cm/s LVOT Vmean:  59.800 cm/s LVOT VTI:    0.188 m  AORTA Ao Root diam: 3.30 cm Ao Asc diam:  3.40 cm MITRAL VALVE MV Area (PHT): 3.60 cm    SHUNTS MV Decel Time: 211 msec    Systemic VTI:  0.19 m MV E velocity: 75.30 cm/s  Systemic Diam: 2.40 cm MV A velocity: 66.10 cm/s MV E/A ratio:  1.14 Gaylyn Keas MD Electronically signed by Gaylyn Keas MD Signature Date/Time: 09/26/2023/3:50:16 PM    Final    MR BRAIN WO CONTRAST Result Date: 09/26/2023 CLINICAL DATA:  The sided arm and leg weakness with sensation deficit EXAM: MRI HEAD WITHOUT CONTRAST TECHNIQUE: Multiplanar, multiecho pulse sequences of the brain and surrounding structures were obtained without intravenous contrast. COMPARISON:  Head CT and CTA from yesterday FINDINGS: Brain: Acute infarct at the right thalamus involving the lateral nucleus and measuring 1 cm. Patchy low-density in the cerebral white matter attributed to chronic small vessel ischemia. T2 hyperintensity in the upper and central pons, likely from chronic small vessel ischemia, although there is a somewhat triangular-shaped would expect prior pontine myelinolysis to be clinically known. Tubular gradient hypointensity at the right ambient cistern correlating with the prior CTA findings at the P2 segment. No acute hemorrhage, hydrocephalus, mass, or collection. Vascular: No change from preceding CTA Skull and upper cervical spine: No focal marrow lesion. Partially covered radicular cyst associated with tooth 9/10 by prior CT. Sinuses/Orbits: No acute finding IMPRESSION: Acute lacunar infarct in the right thalamus. Moderate  chronic white matter disease. Electronically Signed    By: Ronnette Coke M.D.   On: 09/26/2023 08:15   CT ANGIO HEAD NECK W WO CM Result Date: 09/25/2023 CLINICAL DATA:  Acute neurologic deficit.  Left-sided weakness. EXAM: CT ANGIOGRAPHY HEAD AND NECK WITH AND WITHOUT CONTRAST TECHNIQUE: Multidetector CT imaging of the head and neck was performed using the standard protocol during bolus administration of intravenous contrast. Multiplanar CT image reconstructions and MIPs were obtained to evaluate the vascular anatomy. Carotid stenosis measurements (when applicable) are obtained utilizing NASCET criteria, using the distal internal carotid diameter as the denominator. RADIATION DOSE REDUCTION: This exam was performed according to the departmental dose-optimization program which includes automated exposure control, adjustment of the mA and/or kV according to patient size and/or use of iterative reconstruction technique. CONTRAST:  75mL OMNIPAQUE  IOHEXOL  350 MG/ML SOLN COMPARISON:  None Available. FINDINGS: CTA NECK FINDINGS Skeleton: No acute abnormality or high grade bony spinal canal stenosis. Other neck: Normal pharynx, larynx and major salivary glands. No cervical lymphadenopathy. Unremarkable thyroid gland. Upper chest: No pneumothorax or pleural effusion. No nodules or masses. Aortic arch: There is no calcific atherosclerosis of the aortic arch. Conventional 3 vessel aortic branching pattern. RIGHT carotid system: Normal without aneurysm, dissection or stenosis. LEFT carotid system: Normal without aneurysm, dissection or stenosis. Vertebral arteries: Left dominant configuration. There is no dissection, occlusion or flow-limiting stenosis to the skull base (V1-V3 segments). CTA HEAD FINDINGS POSTERIOR CIRCULATION: Vertebral arteries are normal. No proximal occlusion of the anterior or inferior cerebellar arteries. Basilar artery is normal. Superior cerebellar arteries are normal. There is occlusion of the right PCA mid P2 segment. Both PCAs arise from fetal  origins. The occluded segment measures approximately 6 mm in length with distal reconstitution. The left PCA is normal. ANTERIOR CIRCULATION: Intracranial internal carotid arteries are normal. Anterior cerebral arteries are normal. Middle cerebral arteries are normal. Venous sinuses: As permitted by contrast timing, patent. Anatomic variants: None Review of the MIP images confirms the above findings. IMPRESSION: 1. 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 2. No other intracranial arterial occlusion or high-grade stenosis. 3. Normal CTA of the neck. Electronically Signed   By: Juanetta Nordmann M.D.   On: 09/25/2023 23:29   CT HEAD CODE STROKE WO CONTRAST Result Date: 09/25/2023 CLINICAL DATA:  Code stroke.  Left-sided weakness EXAM: CT HEAD WITHOUT CONTRAST TECHNIQUE: Contiguous axial images were obtained from the base of the skull through the vertex without intravenous contrast. RADIATION DOSE REDUCTION: This exam was performed according to the departmental dose-optimization program which includes automated exposure control, adjustment of the mA and/or kV according to patient size and/or use of iterative reconstruction technique. COMPARISON:  None Available. FINDINGS: Brain: There is no mass, hemorrhage or extra-axial collection. The size and configuration of the ventricles and extra-axial CSF spaces are normal. Focal hypoattenuation of the right thalamus along the posterior limb of the right internal capsule, likely an acute/early subacute infarct. Vascular: No abnormal hyperdensity of the major intracranial arteries or dural venous sinuses. No intracranial atherosclerosis. Skull: The visualized skull base, calvarium and extracranial soft tissues are normal. Sinuses/Orbits: No fluid levels or advanced mucosal thickening of the visualized paranasal sinuses. No mastoid or middle ear effusion. The orbits are normal. ASPECTS [facility] Stroke Program  Early CT Score) - Ganglionic level infarction (caudate, lentiform nuclei, internal capsule, insula, M1-M3 cortex): 6 - Supraganglionic infarction (M4-M6 cortex): 3 Total score (0-10 with 10 being normal):  9 IMPRESSION: 1. Focal hypoattenuation of the right thalamus along the posterior limb of the right internal capsule, likely an acute/early subacute infarct. 2. No intracranial hemorrhage. 3. ASPECTS is 9. These results were called by telephone at the time of interpretation on 09/25/2023 at 9:30 pm to provider [facility] , who verbally acknowledged these results. Electronically Signed   By: Juanetta Nordmann M.D.   On: 09/25/2023 21:30    Microbiology: No results found for this or any previous visit.  Labs: CBC: Recent Labs  Lab 09/25/23 2011 09/25/23 2035  WBC 11.2*  --   NEUTROABS 7.1  --   HGB 17.5* 18.4*  HCT 52.2* 54.0*  MCV 89.4  --   PLT 224  --    Basic Metabolic Panel: Recent Labs  Lab 09/25/23 2011 09/25/23 2035  NA 137 138  K 3.6 3.8  CL 100 100  CO2 25  --   GLUCOSE 110* 107*  BUN 8 6*  CREATININE 0.88 1.10  CALCIUM  9.3  --    Liver Function Tests: Recent Labs  Lab 09/25/23 2011  AST 29  ALT 41  ALKPHOS 88  BILITOT 0.9  PROT 8.4*  ALBUMIN 4.2   CBG: Recent Labs  Lab 09/25/23 1959  GLUCAP 93    Discharge time spent: greater than 30 minutes.  Signed: Jerline Moon, MD [facility] 09/26/2023

## 2023-09-26 NOTE — Evaluation (Signed)
 Physical Therapy Evaluation Patient Details Name: Andrew Wang MRN: [id number] DOB: Nov 15, 1955 Today's Date: 09/26/2023  History of Present Illness  68 year old man PMH including diabetes, hypertension not on medications secondary to noncompliance who presented with left-sided numbness.  Admitted for acute stroke. Acute right thalamic infarct with associated numbness left face, hand, lower leg and foot--Permissive hypertension.  MRI confirmed thalamic infarct.  Clinical Impression  Patient evaluated by Physical Therapy with no further acute PT needs identified. All education has been completed and the patient has no further questions.  Pt very pleasant and cooperative. Independent and active at his baseline.  Pt continues to have decr sensation L hand and foot, strength ~ 5/5 bil UEs/LEs, AROM WFL bil UEs/LEs.  Pt is overall independent with mobility, exhibits intermittent decr toe clearance on L during gait, cautioned pt to slow speed of gait initially for incr safety and fall prevention. Reviewed AROM exercises L hand and ankle. Pt is able to return demo exercises and verbalize slowing gait speed for improved safety.   See below for any follow-up Physical Therapy or equipment needs. PT is signing off. Thank you for this referral.         If plan is discharge home, recommend the following:     Can travel by private vehicle        Equipment Recommendations None recommended by PT  Recommendations for Other Services       Functional Status Assessment Patient has not had a recent decline in their functional status     Precautions / Restrictions Precautions Precautions: Fall      Mobility  Bed Mobility Overal bed mobility: Independent                  Transfers Overall transfer level: Independent                      Ambulation/Gait Ambulation/Gait assistance: Supervision Gait Distance (Feet): 120 Feet Assistive device: None Gait Pattern/deviations:  WFL(Within Functional Limits)       General Gait Details: intermittent decr L foot clearance however no LOB.  Stairs            Wheelchair Mobility     Tilt Bed    Modified Rankin (Stroke Patients Only)       Balance Overall balance assessment: Independent                                           Pertinent Vitals/Pain Pain Assessment Pain Assessment: No/denies pain    Home Living Family/patient expects to be discharged to:: Private residence Living Arrangements: Alone Available Help at Discharge: Family Type of Home: House Home Access: Stairs to enter Entrance Stairs-Rails: None Secretary/administrator of Steps: 3   Home Layout: One level Home Equipment: None      Prior Function Prior Level of Function : Independent/Modified Independent                     Extremity/Trunk Assessment   Upper Extremity Assessment Upper Extremity Assessment: Overall WFL for tasks assessed;Right hand dominant;LUE deficits/detail (AROM WFL, strength 5/5 bil) LUE Deficits / Details: L hand decr to light touch LUE Sensation: decreased light touch  Performs finger to thumb - WFL  Lower Extremity Assessment Lower Extremity Assessment: Overall WFL for tasks assessed;LLE deficits/detail (AROM WFL, strength 5/5 bil) LLE Deficits / Details: L medial  lower leg and  foot decr to light touch       Communication   Communication Communication: No apparent difficulties    Cognition Arousal: Alert Behavior During Therapy: WFL for tasks assessed/performed   PT - Cognitive impairments: No apparent impairments                         Following commands: Intact       Cueing Cueing Techniques: Verbal cues     General Comments      Exercises     Assessment/Plan    PT Assessment Patient does not need any further PT services  PT Problem List         PT Treatment Interventions      PT Goals (Current goals can be found in the Care  Plan section)  Acute Rehab PT Goals Patient Stated Goal: to go home today PT Goal Formulation: All assessment and education complete, DC therapy    Frequency       Co-evaluation               AM-PAC PT "6 Clicks" Mobility  Outcome Measure Help needed turning from your back to your side while in a flat bed without using bedrails?: None Help needed moving from lying on your back to sitting on the side of a flat bed without using bedrails?: None Help needed moving to and from a bed to a chair (including a wheelchair)?: None Help needed standing up from a chair using your arms (e.g., wheelchair or bedside chair)?: None Help needed to walk in hospital room?: None Help needed climbing 3-5 steps with a railing? : None 6 Click Score: 24    End of Session Equipment Utilized During Treatment: Gait belt Activity Tolerance: Patient tolerated treatment well Patient left: in bed;with call bell/phone within reach   PT Visit Diagnosis: Other symptoms and signs involving the nervous system (R29.898)    Time: 1610-9604 PT Time Calculation (min) (ACUTE ONLY): 13 min   Charges:   PT Evaluation $PT Eval Low Complexity: 1 Low   PT General Charges $$ ACUTE PT VISIT: 1 Visit         Jacarra Bobak, PT  Acute Rehab Dept [facility]) [phone]  09/26/2023   [facility] 09/26/2023, 4:27 PM

## 2023-09-26 NOTE — Hospital Course (Addendum)
 68 year old man PMH including diabetes, hypertension not on medications secondary to noncompliance who presented with left-sided numbness.  Admitted for acute stroke.   Consultants Neurology   Procedures/Events

## 2023-09-27 ENCOUNTER — Other Ambulatory Visit (HOSPITAL_COMMUNITY): Payer: Self-pay

## 2023-10-01 ENCOUNTER — Other Ambulatory Visit (HOSPITAL_COMMUNITY): Payer: Self-pay

## 2023-12-26 ENCOUNTER — Other Ambulatory Visit (HOSPITAL_COMMUNITY): Payer: Self-pay

## 2023-12-26 ENCOUNTER — Ambulatory Visit: Admitting: Diagnostic Neuroimaging

## 2023-12-31 ENCOUNTER — Emergency Department (HOSPITAL_COMMUNITY)
Admission: EM | Admit: 2023-12-31 | Discharge: 2023-12-31 | Disposition: A | Discharge status: Home / Self Care | Attending: Emergency Medicine | Admitting: Emergency Medicine

## 2023-12-31 DIAGNOSIS — I10 Essential (primary) hypertension: Secondary | ICD-10-CM | POA: Diagnosis not present

## 2023-12-31 DIAGNOSIS — E119 Type 2 diabetes mellitus without complications: Secondary | ICD-10-CM | POA: Diagnosis not present

## 2023-12-31 DIAGNOSIS — Z7982 Long term (current) use of aspirin: Secondary | ICD-10-CM | POA: Insufficient documentation

## 2023-12-31 DIAGNOSIS — Z7901 Long term (current) use of anticoagulants: Secondary | ICD-10-CM | POA: Diagnosis not present

## 2023-12-31 DIAGNOSIS — Z760 Encounter for issue of repeat prescription: Secondary | ICD-10-CM | POA: Diagnosis present

## 2023-12-31 DIAGNOSIS — Z79899 Other long term (current) drug therapy: Secondary | ICD-10-CM | POA: Diagnosis not present

## 2023-12-31 DIAGNOSIS — Z7984 Long term (current) use of oral hypoglycemic drugs: Secondary | ICD-10-CM | POA: Insufficient documentation

## 2023-12-31 MED ORDER — ASPIRIN 81 MG PO TBEC
81.0000 mg | DELAYED_RELEASE_TABLET | Freq: Every day | ORAL | 0 refills | Status: DC
  Filled 2023-12-31: qty 30, 30d supply, fill #0

## 2023-12-31 MED ORDER — ATORVASTATIN CALCIUM 80 MG PO TABS
80.0000 mg | ORAL_TABLET | Freq: Every day | ORAL | 0 refills | Status: DC
  Filled 2023-12-31: qty 30, 30d supply, fill #0

## 2023-12-31 MED ORDER — METFORMIN HCL ER 500 MG PO TB24
500.0000 mg | ORAL_TABLET | Freq: Two times a day (BID) | ORAL | 0 refills | Status: DC
  Filled 2023-12-31: qty 60, 30d supply, fill #0

## 2023-12-31 MED ORDER — LISINOPRIL 20 MG PO TABS
20.0000 mg | ORAL_TABLET | Freq: Every day | ORAL | 0 refills | Status: DC
  Filled 2023-12-31: qty 30, 30d supply, fill #0

## 2023-12-31 NOTE — ED Provider Notes (Signed)
 [place] EMERGENCY DEPARTMENT AT [facility] Provider Note   CSN: [id number] Arrival date & time: 12/31/23  2154     Patient presents with: Medication Refill   Andrew Wang is a 68 y.o. male.   The history is provided by the patient and medical records. No language interpreter was used.  Medication Refill    68 year old male history hypertension, diabetes, hyperlipoidemia, recently diagnosed for acute stroke presenting requesting for medication refill.  Patient states since he got discharged from hospital back in April, he has been compliant with his medication but recently just ran out a few days ago.  Today he attempted to go to multiple urgent care center to find a primary care provider since he does not have one and he was instructed to come to the ER instead.  He admits that he has not follow-up with any neurologist or with any PCP because I overslept he is denying having any new symptoms and here just for medication refill.  States he is mostly out of all of his medications  Prior to Admission medications   Medication Sig Start Date End Date Taking? Authorizing Provider  aspirin  EC 81 MG tablet Take 1 tablet (81 mg total) by mouth daily. Swallow whole. 09/27/23   Jadine Toribio SQUIBB, MD  atorvastatin  (LIPITOR) 80 MG tablet Take 1 tablet (80 mg total) by mouth daily. 09/27/23   Jadine Toribio SQUIBB, MD  clopidogrel  (PLAVIX ) 75 MG tablet Take 1 tablet (75 mg total) by mouth daily. 09/27/23   Jadine Toribio SQUIBB, MD  lisinopril  (ZESTRIL ) 20 MG tablet Take 1 tablet (20 mg total) by mouth daily. 09/26/23   Jadine Toribio SQUIBB, MD  metFORMIN  (GLUCOPHAGE -XR) 500 MG 24 hr tablet Take 1 tablet (500 mg total) by mouth 2 (two) times daily with a meal. 11/03/20   Mayers, Cari S, PA-C    Allergies: Penicillins    Review of Systems  All other systems reviewed and are negative.   Updated Vital Signs BP (!) 165/107 (BP Location: Left Arm) Comment: Pt did not take BP medicine this AM due  to being out of medication. RN notified  Pulse 79   Temp 98.4 F (36.9 C) (Oral)   Resp 18   Ht 6' 3 (1.905 m)   Wt 113.4 kg   SpO2 100%   BMI 31.25 kg/m   Physical Exam Constitutional:      General: He is not in acute distress.    Appearance: He is well-developed.  HENT:     Head: Atraumatic.  Eyes:     Conjunctiva/sclera: Conjunctivae normal.  Musculoskeletal:     Cervical back: Normal range of motion and neck supple.  Skin:    Findings: No rash.  Neurological:     Mental Status: He is alert.     (all labs ordered are listed, but only abnormal results are displayed) Labs Reviewed - No data to display  EKG: None  Radiology: No results found.   Procedures   Medications Ordered in the ED - No data to display                                  Medical Decision Making  BP (!) 165/107 (BP Location: Left Arm) Comment: Pt did not take BP medicine this AM due to being out of medication. RN notified  Pulse 79   Temp 98.4 F (36.9 C) (Oral)   Resp 18  Ht 6' 3 (1.905 m)   Wt 113.4 kg   SpO2 100%   BMI 31.25 kg/m   52:58 PM  68 year old male history hypertension, diabetes, hyperlipoidemia, recently diagnosed for acute stroke presenting requesting for medication refill.  Patient states since he got discharged from hospital back in April, he has been compliant with his medication but recently just ran out a few days ago.  Today he attempted to go to multiple urgent care center to find a primary care provider since he does not have one and he was instructed to come to the ER instead.  He admits that he has not follow-up with any neurologist or with any PCP because I overslept he is denying having any new symptoms and here just for medication refill.  States he is mostly out of all of his medications  On exam patient is resting comfortably appears to be in no acute discomfort, speaking complete sentence, no focal neurodeficit.  Vital signs notable for mildly elevated  blood pressure of 165/107.  EMR reviewed from previous hospital admission back in April, patient was planned to be on aspirin  and Plavix  for 3 months then aspirin  alone.  At this time, we will plan to repeat serial baby aspirin , atorvastatin , lisinopril , and metformin .  Will provide patient with outpatient resources to find a PCP and recommend patient follow-up with neurology.  Return precaution given.     Final diagnoses:  Encounter for medication refill    ED Discharge Orders          Ordered    aspirin  EC 81 MG tablet  Daily        12/31/23 2306    atorvastatin  (LIPITOR) 80 MG tablet  Daily        12/31/23 2306    lisinopril  (ZESTRIL ) 20 MG tablet  Daily        12/31/23 2306    metFORMIN  (GLUCOPHAGE -XR) 500 MG 24 hr tablet  2 times daily with meals        12/31/23 2306               Nivia Colon, PA-C 12/31/23 2308    Charlyn Sora, MD 12/31/23 2325

## 2023-12-31 NOTE — ED Triage Notes (Signed)
 Patient arrived stating he was seen in April for a CVA and has run out of all his prescriptions he was given. No complaints at this time.

## 2023-12-31 NOTE — Discharge Instructions (Addendum)
 Please find a primary care provider as well as follow-up with neurologist for further managements of your medical condition.

## 2024-01-01 ENCOUNTER — Other Ambulatory Visit: Payer: Self-pay

## 2024-01-02 ENCOUNTER — Telehealth: Payer: Self-pay

## 2024-01-02 NOTE — Telephone Encounter (Signed)
 Copied from CRM [id number]. Topic: Clinical - Medication Question >> Jan 02, 2024  2:39 PM Diannia H wrote: Reason for CRM: Patient is calling because he is needing a primary care doctor, he is not currently a patient but has an appointment coming up on 08/22 that I just made. He stated he would be out of his meds by then and doesn't want to go to the ER again for refills. He is needing to know in the meantime what are his other options. Could you please assist the patient? Callback number is [phone].  Please Advise, He is a new patient and has made an appointment with Ngetich, Roxan BROCKS, NP on 01/25/2024  Message sent Ngetich, Roxan BROCKS, NP

## 2024-01-02 NOTE — Telephone Encounter (Signed)
 May need to schedule an acute visit with any available provider or obtain medication from urgent care.

## 2024-01-07 NOTE — Telephone Encounter (Signed)
 Patient does have appointment with Jereld Berneda Delude, NP for medication refill that help him through until he is able to Ngetich, Dinah on 01/25/24.  Message sent to [employer], Dinah

## 2024-01-08 ENCOUNTER — Encounter: Admitting: Adult Health

## 2024-01-09 ENCOUNTER — Encounter (HOSPITAL_COMMUNITY): Payer: Self-pay | Admitting: Emergency Medicine

## 2024-01-09 ENCOUNTER — Other Ambulatory Visit: Payer: Self-pay

## 2024-01-09 ENCOUNTER — Emergency Department (HOSPITAL_COMMUNITY)
Admission: EM | Admit: 2024-01-09 | Discharge: 2024-01-09 | Discharge status: Against Medical Advice | Attending: Emergency Medicine | Admitting: Emergency Medicine

## 2024-01-09 DIAGNOSIS — R21 Rash and other nonspecific skin eruption: Secondary | ICD-10-CM | POA: Diagnosis present

## 2024-01-09 DIAGNOSIS — Z5321 Procedure and treatment not carried out due to patient leaving prior to being seen by health care provider: Secondary | ICD-10-CM | POA: Insufficient documentation

## 2024-01-09 DIAGNOSIS — R252 Cramp and spasm: Secondary | ICD-10-CM | POA: Insufficient documentation

## 2024-01-09 NOTE — ED Triage Notes (Addendum)
 Pt in ambulatory with itchy rash to R upper back and states he is having a lot of eye spasms to his L eye. Pt is concerned he has ringworm, has been putting OTC Lotrimin on the rash since yesterday. Mentions he has some white mold in his house, denies any breathing issues.

## 2024-01-09 NOTE — Progress Notes (Signed)
 This encounter was created in error - please disregard.

## 2024-01-25 ENCOUNTER — Ambulatory Visit: Admitting: Family

## 2024-01-25 ENCOUNTER — Encounter: Payer: Self-pay | Admitting: Family

## 2024-01-25 ENCOUNTER — Other Ambulatory Visit: Payer: Self-pay

## 2024-01-25 VITALS — BP 138/84 | HR 79 | Temp 97.7°F | Resp 20 | Ht 75.0 in | Wt 216.6 lb

## 2024-01-25 DIAGNOSIS — E119 Type 2 diabetes mellitus without complications: Secondary | ICD-10-CM

## 2024-01-25 DIAGNOSIS — Z1159 Encounter for screening for other viral diseases: Secondary | ICD-10-CM

## 2024-01-25 DIAGNOSIS — Z23 Encounter for immunization: Secondary | ICD-10-CM

## 2024-01-25 DIAGNOSIS — I10 Essential (primary) hypertension: Secondary | ICD-10-CM

## 2024-01-25 DIAGNOSIS — Z1211 Encounter for screening for malignant neoplasm of colon: Secondary | ICD-10-CM

## 2024-01-25 DIAGNOSIS — E782 Mixed hyperlipidemia: Secondary | ICD-10-CM

## 2024-01-25 DIAGNOSIS — Z8673 Personal history of transient ischemic attack (TIA), and cerebral infarction without residual deficits: Secondary | ICD-10-CM

## 2024-01-25 DIAGNOSIS — B354 Tinea corporis: Secondary | ICD-10-CM

## 2024-01-25 DIAGNOSIS — K529 Noninfective gastroenteritis and colitis, unspecified: Secondary | ICD-10-CM

## 2024-01-25 DIAGNOSIS — L853 Xerosis cutis: Secondary | ICD-10-CM

## 2024-01-25 DIAGNOSIS — B351 Tinea unguium: Secondary | ICD-10-CM

## 2024-01-25 MED ORDER — FLUCONAZOLE 150 MG PO TABS
150.0000 mg | ORAL_TABLET | ORAL | 0 refills | Status: DC
  Filled 2024-01-25: qty 4, 28d supply, fill #0

## 2024-01-25 MED ORDER — ASPIRIN 81 MG PO TBEC
81.0000 mg | DELAYED_RELEASE_TABLET | Freq: Every day | ORAL | 1 refills | Status: AC
  Filled 2024-01-25: qty 90, 90d supply, fill #0
  Filled 2024-05-07: qty 90, 90d supply, fill #1

## 2024-01-25 MED ORDER — KETOCONAZOLE 2 % EX CREA
1.0000 | TOPICAL_CREAM | Freq: Every day | CUTANEOUS | 0 refills | Status: DC
  Filled 2024-01-25: qty 15, 15d supply, fill #0

## 2024-01-25 MED ORDER — LISINOPRIL 20 MG PO TABS
20.0000 mg | ORAL_TABLET | Freq: Every day | ORAL | 1 refills | Status: AC
  Filled 2024-01-25 – 2024-02-01 (×2): qty 90, 90d supply, fill #0
  Filled 2024-05-07: qty 90, 90d supply, fill #1

## 2024-01-25 MED ORDER — ATORVASTATIN CALCIUM 80 MG PO TABS
80.0000 mg | ORAL_TABLET | Freq: Every day | ORAL | 1 refills | Status: DC
  Filled 2024-01-25 – 2024-02-01 (×2): qty 90, 90d supply, fill #0

## 2024-01-25 NOTE — Progress Notes (Signed)
 Provider: Roxan Plough FNP-C   Theona Muhs, Roxan BROCKS, NP  Patient Care Team: Blaise Palladino, Roxan BROCKS, NP as PCP - General (Family Medicine)  No emergency contact information on file.  Code Status:  Full Code  Goals of care: Advanced Directive information    01/25/2024   12:44 PM  Advanced Directives  Does Patient Have a Medical Advance Directive? No  Would patient like information on creating a medical advance directive? No - Patient declined     Chief Complaint  Patient presents with   Establish Care    New Patient Appointment.    Discussed the use of AI scribe software for clinical note transcription with the patient, who gave verbal consent to proceed.  History of Present Illness   Leopold Smyers is a 68 year old male with a history of stroke and type 2 diabetes who presents for establishment of care and management of his chronic conditions.  He continues to experience symptoms following a stroke on September 25, 2023, including numbness on the left side of his face, particularly around the nose and mouth, and in three fingers (thumb, index, and middle) on his left hand. He describes a persistent tingling sensation but denies any weakness in the hand.  He is currently taking aspirin  daily, atorvastatin  8 mg in the evening for cholesterol, and lisinopril  20 mg for blood pressure. He monitors his blood pressure at home, reporting 'good low numbers' since the stroke. He also takes metformin  500 mg twice daily with meals for type 2 diabetes, although he has not been checking his blood sugar regularly due to consistently low readings.  He has a history of diarrhea following his stroke, which he manages with Imodium, resulting in more solid stools. No abdominal pain or blood in the stool. He mentions a persistent issue with his left eye watering, which he attributes to allergies, though he has also wondered if it could be related to his stroke.  He follows a strict diet, avoiding salt and fried  foods, and primarily consuming vegetables, fruits, and soups. He exercises by walking, particularly when shopping at large stores like [address]. He denies smoking and reports no issues with anxiety or depression, and he sleeps well at night. He has a family history of stroke.  He has a rash on his right upper back extending to the armpit, which is itchy and has been using cream to manage it. He has not had a pneumonia vaccine but has had shingles and flu vaccines in the past.   Past Medical History:  Diagnosis Date   Diabetes (HCC)    Hypertension    Shingles    Stroke [facility])    History reviewed. No pertinent surgical history.  Allergies  Allergen Reactions   Penicillins Anaphylaxis    Childhood allergy        Allergies as of 01/25/2024       Reactions   Penicillins Anaphylaxis   Childhood allergy         Medication List        Accurate as of January 25, 2024  3:41 PM. If you have any questions, ask your nurse or doctor.          aspirin  EC 81 MG tablet Take 1 tablet (81 mg total) by mouth daily. Swallow whole.   atorvastatin  80 MG tablet Commonly known as: LIPITOR Take 1 tablet (80 mg total) by mouth daily.   fluconazole  150 MG tablet Commonly known as: Diflucan  Take 1 tablet (150 mg total)  by mouth once a week. Started by: Josafat Enrico C Marne Meline   ketoconazole  2 % cream Commonly known as: NIZORAL  Apply 1 Application topically daily. Started by: Jarelyn Bambach C Nickalos Petersen   lisinopril  20 MG tablet Commonly known as: ZESTRIL  Take 1 tablet (20 mg total) by mouth daily.   metFORMIN  500 MG 24 hr tablet Commonly known as: GLUCOPHAGE -XR Take 1 tablet (500 mg total) by mouth 2 (two) times daily with a meal.        Review of Systems  Constitutional:  Negative for appetite change, chills, fatigue, fever and unexpected weight change.  HENT:  Positive for dental problem. Negative for congestion, ear discharge, ear pain, facial swelling, hearing loss, nosebleeds, postnasal  drip, rhinorrhea, sinus pressure, sinus pain, sneezing, sore throat, tinnitus and trouble swallowing.        Missing all upper teeth   Eyes:  Negative for pain, discharge, redness, itching and visual disturbance.  Respiratory:  Negative for cough, chest tightness, shortness of breath and wheezing.   Cardiovascular:  Negative for chest pain, palpitations and leg swelling.  Gastrointestinal:  Positive for diarrhea. Negative for abdominal distention, abdominal pain, blood in stool, constipation, nausea and vomiting.  Endocrine: Negative for cold intolerance, heat intolerance, polydipsia, polyphagia and polyuria.  Genitourinary:  Negative for difficulty urinating, dysuria, flank pain, frequency and urgency.  Musculoskeletal:  Negative for arthralgias, back pain, gait problem, joint swelling, myalgias, neck pain and neck stiffness.  Skin:  Positive for rash. Negative for color change, pallor and wound.  Neurological:  Positive for numbness. Negative for dizziness, syncope, speech difficulty, weakness, light-headedness and headaches.       Left face,left thumb,index and middle finger and bottom of left foot numbness late effects of CVA 09/2023   Hematological:  Does not bruise/bleed easily.  Psychiatric/Behavioral:  Negative for agitation, behavioral problems, confusion, hallucinations, self-injury, sleep disturbance and suicidal ideas. The patient is not nervous/anxious.     Immunization History  Administered Date(s) Administered   Influenza Split 06/17/2012   Moderna Sars-Covid-2 Vaccination 08/12/2019, 09/09/2019, 06/19/2020   PNEUMOCOCCAL CONJUGATE-20 01/25/2024   Tdap 05/04/2017   Pertinent  Health Maintenance Due  Topic Date Due   OPHTHALMOLOGY EXAM  Never done   Colonoscopy  Never done   INFLUENZA VACCINE  01/04/2024   HEMOGLOBIN A1C  03/26/2024   FOOT EXAM  01/24/2025      09/08/2021    7:56 PM 05/14/2022   12:43 PM 01/25/2024   12:44 PM  Fall Risk  Falls in the past year?   0   Was there an injury with Fall?   0  Fall Risk Category Calculator   0  (RETIRED) Patient Fall Risk Level Low fall risk  Low fall risk    Patient at Risk for Falls Due to   No Fall Risks  Fall risk Follow up   Falls evaluation completed     Data saved with a previous flowsheet row definition   Functional Status Survey:    Vitals:   01/25/24 1249  BP: 138/84  Pulse: 79  Resp: 20  Temp: 97.7 F (36.5 C)  SpO2: 98%  Weight: 216 lb 9.6 oz (98.2 kg)  Height: 6' 3 (1.905 m)   Body mass index is 27.07 kg/m. Physical Exam  GENERAL: Alert, cooperative, well developed, no acute distress. HEENT: Normocephalic, normal oropharynx, moist mucous membranes, ears normal bilaterally, nose normal, throat normal, oral cavity normal. NECK: Supple, full range of motion. CHEST: Clear to auscultation bilaterally, no wheezes, rhonchi, or crackles. CARDIOVASCULAR:  Normal heart rate and rhythm, S1 and S2 normal without murmurs. ABDOMEN: Soft, non-tender, non-distended, without organomegaly, normal bowel sounds. EXTREMITIES: No cyanosis or edema. NEUROLOGICAL: Cranial nerves grossly intact, moves all extremities without gross motor or sensory deficit, extraocular movements intact, sensation intact in feet. SKIN: No rash,no lesion or erythema.bilateral lower extremities skin dry large dark colored patchy rough rash on right upper flank extending to armpit area.  All toenails long with yellow discoloration.Great toenails Thick and brittle.   PSYCHIATRY/BEHAVIORAL: Mood stable    Labs reviewed: Recent Labs    09/25/23 2011 09/25/23 2035  NA 137 138  K 3.6 3.8  CL 100 100  CO2 25  --   GLUCOSE 110* 107*  BUN 8 6*  CREATININE 0.88 1.10  CALCIUM  9.3  --    Recent Labs    09/25/23 2011  AST 29  ALT 41  ALKPHOS 88  BILITOT 0.9  PROT 8.4*  ALBUMIN 4.2   Recent Labs    09/25/23 2011 09/25/23 2035  WBC 11.2*  --   NEUTROABS 7.1  --   HGB 17.5* 18.4*  HCT 52.2* 54.0*  MCV 89.4  --    PLT 224  --    No results found for: TSH Lab Results  Component Value Date   HGBA1C 6.1 (H) 09/25/2023   Lab Results  Component Value Date   CHOL 180 09/26/2023   HDL 37 (L) 09/26/2023   LDLCALC 118 (H) 09/26/2023   TRIG 123 09/26/2023   CHOLHDL 4.9 09/26/2023    Significant Diagnostic Results in last 30 days:  No results found.  Assessment/Plan  Cerebrovascular accident with residual left-sided numbness Residual numbness on the left side, including the left side of the nose, corner of the mouth, and three fingers (thumb, index, and middle) following a stroke on September 25, 2023. No weakness reported. Possible tearing of the left eye due to stroke-related weakness. Emphasis on controlling risk factors such as hypertension and hyperlipidemia to prevent future strokes. - Continue aspirin  81 mg daily - Follow up with neurologist on September 8 - Monitor blood pressure and cholesterol levels - Encourage regular exercise and healthy diet to manage risk factors  Essential hypertension Hypertension managed with lisinopril  20 mg daily. Blood pressure well-controlled with home monitoring. Emphasis on lifestyle modifications to maintain control. - Continue lisinopril  20 mg daily - Refill lisinopril  with a 90-day supply plus one refill - Encourage regular exercise and healthy diet  Hyperlipidemia Hyperlipidemia managed with atorvastatin  8 mg daily. No reported side effects. Emphasis on monitoring cholesterol levels and lifestyle modifications. - Continue atorvastatin  8 mg daily - Refill atorvastatin  with a 90-day supply plus one refill - Order lab work to check cholesterol levels  Type 2 diabetes mellitus Type 2 diabetes managed with metformin  500 mg twice daily. Blood sugar levels consistently low and well-managed. No recent home glucose monitoring. Emphasis on regular exercise and diet to maintain control. 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 Managed with Imodium with some relief. No abdominal pain or blood in stool reported. Consideration for probiotic use and further investigation if symptoms persist. - Recommend probiotic supplementation - Consider stool specimen collection if diarrhea persists  Onychomycosis of toenails Fungal infection of toenails with thick,  brittle, and broken nails. Family history of similar issues. - Refer to podiatrist for debridement and treatment of fungal infection  Tinea corporis (right upper back and axilla) Itchy rash on the right upper back extending to the axilla. Likely tinea corporis. Previous use of cream with partial relief. - Prescribe ketoconazole  cream for topical application once daily - Prescribe Diflucan  150 mg once weekly for four weeks  Dry skin of lower extremities Very dry skin on lower extremities. No current use of lotion or cream. - Recommend use of Cetaphil cream or Vaseline for skin hydration  Chronic periodontitis Chronic periodontitis with no recent dental visits. Importance of dental hygiene emphasized to prevent systemic complications. - Recommend dental cleaning at least once a year - A dvise to find a local dentist for regular check-ups  General Health Maintenance Discussion on immunizations and screenings. Importance of vaccinations due to diabetes and stroke history. Emphasis on preventative care measures. - Administer pneumonia vaccine today - Recommend flu shot starting in September - Recommend shingles vaccine at pharmacy - Order Cologuard for colon cancer screening   Family/ staff Communication: Reviewed plan of care with patient verbalized understanding   Labs/tests ordered:   -Cologuard  - urine microalbumin Creatinine ration  - - CBC with Differential/Platelet - CMP with eGFR([facility]) - TSH - Hgb A1C - Lipid panel - Hep C Antibody  Next Appointment : Return in about 6 months (around 07/27/2024) for medical mangement of chronic issues., Annual wellness visit soon .   Spent 59 minutes of Face to face and non-face to face with patient  >50% time spent counseling; reviewing medical record; tests; labs; documentation and developing future plan of care.   Roxan JAYSON Plough, NP

## 2024-01-25 NOTE — Patient Instructions (Signed)
 Please get shingles,Influenza and COVID-19 vaccine at the Pharmacy

## 2024-01-28 ENCOUNTER — Other Ambulatory Visit: Payer: Self-pay

## 2024-01-29 ENCOUNTER — Other Ambulatory Visit (HOSPITAL_COMMUNITY): Payer: Self-pay

## 2024-01-29 ENCOUNTER — Other Ambulatory Visit: Payer: Self-pay | Admitting: Family

## 2024-01-29 LAB — COMPREHENSIVE METABOLIC PANEL WITH GFR
AG Ratio: 1.5 (calc) (ref 1.0–2.5)
ALT: 29 U/L (ref 9–46)
AST: 23 U/L (ref 10–35)
Albumin: 4.6 g/dL (ref 3.6–5.1)
Alkaline phosphatase (APISO): 100 U/L (ref 35–144)
BUN: 7 mg/dL (ref 7–25)
CO2: 30 mmol/L (ref 20–32)
Calcium: 9.6 mg/dL (ref 8.6–10.3)
Chloride: 101 mmol/L (ref 98–110)
Creat: 1.06 mg/dL (ref 0.70–1.35)
Globulin: 3 g/dL (ref 1.9–3.7)
Glucose, Bld: 96 mg/dL (ref 65–99)
Potassium: 4.5 mmol/L (ref 3.5–5.3)
Sodium: 138 mmol/L (ref 135–146)
Total Bilirubin: 0.6 mg/dL (ref 0.2–1.2)
Total Protein: 7.6 g/dL (ref 6.1–8.1)
eGFR: 76 mL/min/1.73m2 (ref >=60)

## 2024-01-29 LAB — LIPID PANEL
Cholesterol: 91 mg/dL (ref <200)
HDL: 41 mg/dL (ref >=40)
LDL Cholesterol (Calc): 35 mg/dL
Non-HDL Cholesterol (Calc): 50 mg/dL (ref <130)
Total CHOL/HDL Ratio: 2.2 (calc) (ref <5.0)
Triglycerides: 72 mg/dL (ref <150)

## 2024-01-29 LAB — MICROALBUMIN / CREATININE URINE RATIO
Creatinine, Urine: 181 mg/dL (ref 20–320)
Microalb Creat Ratio: 6 mg/g{creat} (ref <30)
Microalb, Ur: 1 mg/dL

## 2024-01-29 LAB — CBC WITH DIFFERENTIAL/PLATELET
Absolute Lymphocytes: 2740 {cells}/uL (ref 850–3900)
Absolute Monocytes: 551 {cells}/uL (ref 200–950)
Basophils Absolute: 41 {cells}/uL (ref 0–200)
Basophils Relative: 0.6 %
Eosinophils Absolute: 48 {cells}/uL (ref 15–500)
Eosinophils Relative: 0.7 %
HCT: 49.3 % (ref 38.5–50.0)
Hemoglobin: 16.3 g/dL (ref 13.2–17.1)
MCH: 29.9 pg (ref 27.0–33.0)
MCHC: 33.1 g/dL (ref 32.0–36.0)
MCV: 90.5 fL (ref 80.0–100.0)
MPV: 11.1 fL (ref 7.5–12.5)
Monocytes Relative: 8.1 %
Neutro Abs: 3420 {cells}/uL (ref 1500–7800)
Neutrophils Relative %: 50.3 %
Platelets: 177 Thousand/uL (ref 140–400)
RBC: 5.45 Million/uL (ref 4.20–5.80)
RDW: 13.2 % (ref 11.0–15.0)
Total Lymphocyte: 40.3 %
WBC: 6.8 Thousand/uL (ref 3.8–10.8)

## 2024-01-29 LAB — TSH: TSH: 1.19 m[IU]/L (ref 0.40–4.50)

## 2024-01-29 LAB — HCV RNA,QUANTITATIVE REAL TIME PCR
HCV Quantitative Log: 5.56 {Log_IU}/mL — ABNORMAL HIGH
HCV RNA, PCR, QN: 360000 [IU]/mL — ABNORMAL HIGH

## 2024-01-29 LAB — HEPATITIS C ANTIBODY: Hepatitis C Ab: REACTIVE — AB

## 2024-01-30 ENCOUNTER — Other Ambulatory Visit: Payer: Self-pay | Admitting: Family

## 2024-01-30 DIAGNOSIS — E782 Mixed hyperlipidemia: Secondary | ICD-10-CM

## 2024-01-30 DIAGNOSIS — Z8673 Personal history of transient ischemic attack (TIA), and cerebral infarction without residual deficits: Secondary | ICD-10-CM

## 2024-01-30 DIAGNOSIS — I10 Essential (primary) hypertension: Secondary | ICD-10-CM

## 2024-01-30 NOTE — Telephone Encounter (Signed)
 Copied from CRM [id number]. Topic: Clinical - Medication Refill >> Jan 30, 2024 12:15 PM Laurier C wrote: Medication:  lisinopril  (ZESTRIL ) 20 MG tablet atorvastatin  (LIPITOR) 80 MG tablet  Has the patient contacted their pharmacy? Yes (Agent: If no, request that the patient contact the pharmacy for the refill. If patient does not wish to contact the pharmacy document the reason why and proceed with request.) (Agent: If yes, when and what did the pharmacy advise?)  This is the patient's preferred pharmacy:  [facility] [address], [address] [place] [address] Phone: [phone] Fax: [phone]  Is this the correct pharmacy for this prescription? Yes If no, delete pharmacy and type the correct one.   Has the prescription been filled recently? Yes  Is the patient out of the medication? Yes  Has the patient been seen for an appointment in the last year OR does the patient have an upcoming appointment? Yes  Can we respond through MyChart? No  Agent: Please be advised that Rx refills may take up to 3 business days. We ask that you follow-up with your pharmacy.

## 2024-02-01 ENCOUNTER — Other Ambulatory Visit: Payer: Self-pay | Admitting: Family

## 2024-02-01 ENCOUNTER — Other Ambulatory Visit: Payer: Self-pay

## 2024-02-01 DIAGNOSIS — E119 Type 2 diabetes mellitus without complications: Secondary | ICD-10-CM

## 2024-02-01 MED ORDER — METFORMIN HCL ER 500 MG PO TB24
500.0000 mg | ORAL_TABLET | Freq: Two times a day (BID) | ORAL | 0 refills | Status: AC
  Filled 2024-02-01: qty 60, 30d supply, fill #0

## 2024-02-05 ENCOUNTER — Encounter: Admitting: Family

## 2024-02-06 ENCOUNTER — Ambulatory Visit: Payer: Self-pay | Admitting: Family

## 2024-02-10 NOTE — Progress Notes (Signed)
   This encounter was created in error - please disregard. No show

## 2024-02-11 ENCOUNTER — Ambulatory Visit: Admitting: Neurology

## 2024-02-11 ENCOUNTER — Telehealth: Payer: Self-pay

## 2024-02-11 ENCOUNTER — Encounter: Payer: Self-pay | Admitting: Neurology

## 2024-02-11 DIAGNOSIS — B1920 Unspecified viral hepatitis C without hepatic coma: Secondary | ICD-10-CM

## 2024-02-11 NOTE — Telephone Encounter (Signed)
 Patient called stating that hep c labs came back positive. Routing to patient PCP we will need a referral before patient can schedule an appointment and our referral coordinator.    Vernida Mcnicholas SHAUNNA Letters, CMA

## 2024-02-12 NOTE — Telephone Encounter (Signed)
 Noted

## 2024-02-12 NOTE — Telephone Encounter (Signed)
 Roxan Plough, NP please place referral to [facility] so patient can be scheduled and seen. Message routed back to PCP Ngetich, Roxan BROCKS, NP

## 2024-02-12 NOTE — Addendum Note (Signed)
 Addended byBETHA LEONARDA BURDOCK C on: 02/12/2024 03:50 PM   Modules accepted: Orders

## 2024-02-12 NOTE — Telephone Encounter (Signed)
 Has been addressed already in previous message.

## 2024-02-12 NOTE — Telephone Encounter (Signed)
 Infectious disease referral ordered

## 2024-02-14 ENCOUNTER — Ambulatory Visit: Admitting: Family

## 2024-02-18 ENCOUNTER — Ambulatory Visit (INDEPENDENT_AMBULATORY_CARE_PROVIDER_SITE_OTHER): Admitting: Family

## 2024-02-18 ENCOUNTER — Encounter: Payer: Self-pay | Admitting: Family

## 2024-02-18 ENCOUNTER — Other Ambulatory Visit: Payer: Self-pay

## 2024-02-18 VITALS — BP 136/88 | HR 87 | Temp 97.9°F | Resp 19 | Ht 75.0 in | Wt 216.2 lb

## 2024-02-18 DIAGNOSIS — Z8673 Personal history of transient ischemic attack (TIA), and cerebral infarction without residual deficits: Secondary | ICD-10-CM

## 2024-02-18 DIAGNOSIS — Z23 Encounter for immunization: Secondary | ICD-10-CM | POA: Diagnosis not present

## 2024-02-18 DIAGNOSIS — E782 Mixed hyperlipidemia: Secondary | ICD-10-CM | POA: Diagnosis not present

## 2024-02-18 DIAGNOSIS — B354 Tinea corporis: Secondary | ICD-10-CM | POA: Diagnosis not present

## 2024-02-18 DIAGNOSIS — B351 Tinea unguium: Secondary | ICD-10-CM

## 2024-02-18 MED ORDER — FLUCONAZOLE 150 MG PO TABS
150.0000 mg | ORAL_TABLET | ORAL | 0 refills | Status: AC
  Filled 2024-02-18: qty 4, 28d supply, fill #0

## 2024-02-18 MED ORDER — ATORVASTATIN CALCIUM 80 MG PO TABS
80.0000 mg | ORAL_TABLET | Freq: Every day | ORAL | 1 refills | Status: AC
  Filled 2024-02-18 – 2024-05-07 (×2): qty 90, 90d supply, fill #0

## 2024-02-18 MED ORDER — KETOCONAZOLE 2 % EX CREA
1.0000 | TOPICAL_CREAM | Freq: Every day | CUTANEOUS | 3 refills | Status: AC
  Filled 2024-02-18: qty 30, 30d supply, fill #0

## 2024-02-18 NOTE — Patient Instructions (Signed)
 1.Stop at check out and schedule Annual Wellness Visit.

## 2024-02-18 NOTE — Progress Notes (Signed)
 Provider: Roxan Plough FNP-C  Inioluwa Baris, Roxan BROCKS, NP  Patient Care Team: Jeslin Bazinet, Roxan BROCKS, NP as PCP - General (Family Medicine)  No emergency contact information on file.  Code Status:  Full Code  Goals of care: Advanced Directive information    01/25/2024   12:44 PM  Advanced Directives  Does Patient Have a Medical Advance Directive? No  Would patient like information on creating a medical advance directive? No - Patient declined     Chief Complaint  Patient presents with   Follow-up    Infection on back.    Discussed the use of AI scribe software for clinical note transcription with the patient, who gave verbal consent to proceed.  History of Present Illness   Andrew Wang is a 68 year old male who presents with a persistent rash and itching.  He has a rash on his upper back extending to under the armpits, with the armpit area showing signs of healing. The rash sometimes itches severely. He has been using ketoconazole  cream once daily and taking Diflucan  150 mg weekly for four weeks, noting some improvement but describes the rash as 'getting better slowly'.  He has a history of stroke affecting his left side, resulting in muscle weakness around his eye, causing tears to flow from the left eye. He also experiences nerve sensations around the left side of his mouth and nose. He is scheduled to see his neurologist tomorrow.  No fever.    Past Medical History:  Diagnosis Date   Diabetes (HCC)    Hypertension    Shingles    Stroke [facility])    History reviewed. No pertinent surgical history.  Allergies  Allergen Reactions   Penicillins Anaphylaxis    Childhood allergy        Outpatient Encounter Medications as of 02/18/2024  Medication Sig   aspirin  EC 81 MG tablet Take 1 tablet (81 mg total) by mouth daily. Swallow whole.   lisinopril  (ZESTRIL ) 20 MG tablet Take 1 tablet (20 mg total) by mouth daily.   metFORMIN  (GLUCOPHAGE -XR) 500 MG 24 hr tablet Take 1 tablet  (500 mg total) by mouth 2 (two) times daily with a meal.   [DISCONTINUED] atorvastatin  (LIPITOR) 80 MG tablet Take 1 tablet (80 mg total) by mouth daily.   [DISCONTINUED] fluconazole  (DIFLUCAN ) 150 MG tablet Take 1 tablet (150 mg total) by mouth once a week.   [DISCONTINUED] ketoconazole  (NIZORAL ) 2 % cream Apply 1 Application topically daily.   atorvastatin  (LIPITOR) 80 MG tablet Take 1 tablet (80 mg total) by mouth daily.   fluconazole  (DIFLUCAN ) 150 MG tablet Take 1 tablet (150 mg total) by mouth once a week.   ketoconazole  (NIZORAL ) 2 % cream Apply 1 Application topically daily.   No facility-administered encounter medications on file as of 02/18/2024.    Review of Systems  Constitutional:  Negative for appetite change, chills, fatigue, fever and unexpected weight change.  HENT:  Negative for congestion, ear discharge, ear pain, hearing loss, nosebleeds, postnasal drip, rhinorrhea, sinus pressure, sinus pain, sneezing, sore throat and tinnitus.   Eyes:  Negative for pain, discharge, redness, itching and visual disturbance.  Respiratory:  Negative for cough, chest tightness, shortness of breath and wheezing.   Cardiovascular:  Negative for chest pain, palpitations and leg swelling.  Gastrointestinal:  Negative for abdominal distention, abdominal pain, diarrhea, nausea and vomiting.  Skin:  Positive for rash. Negative for color change, pallor and wound.  Neurological:  Positive for numbness. Negative for dizziness, syncope, speech  difficulty, weakness, light-headedness and headaches.    Immunization History  Administered Date(s) Administered   INFLUENZA, HIGH DOSE SEASONAL PF 02/18/2024   Influenza Split 06/17/2012   Moderna Sars-Covid-2 Vaccination 08/12/2019, 09/09/2019, 06/19/2020   PNEUMOCOCCAL CONJUGATE-20 01/25/2024   Tdap 05/04/2017   Pertinent  Health Maintenance Due  Topic Date Due   OPHTHALMOLOGY EXAM  Never done   Colonoscopy  03/14/2024 (Originally 02/06/2000)    HEMOGLOBIN A1C  03/26/2024   FOOT EXAM  01/24/2025   Influenza Vaccine  Completed      09/08/2021    7:56 PM 05/14/2022   12:43 PM 01/25/2024   12:44 PM  Fall Risk  Falls in the past year?   0  Was there an injury with Fall?   0  Fall Risk Category Calculator   0  (RETIRED) Patient Fall Risk Level Low fall risk  Low fall risk    Patient at Risk for Falls Due to   No Fall Risks  Fall risk Follow up   Falls evaluation completed     Data saved with a previous flowsheet row definition   Functional Status Survey:    Vitals:   02/18/24 1353  Pulse: 87  Resp: 19  Temp: 97.9 F (36.6 C)  SpO2: 98%  Weight: 216 lb 3.2 oz (98.1 kg)  Height: 6' 3 (1.905 m)   Body mass index is 27.02 kg/m. Physical Exam Physical Exam   VITALS: T- 97.9, P- 87, BP- 136/88, SaO2- 98% MEASUREMENTS: Weight- 216. GENERAL: Alert, cooperative, well developed, no acute distress HEENT: Normocephalic, normal oropharynx, moist mucous membranes CHEST: Clear to auscultation bilaterally, no wheezes, rhonchi, or crackles CARDIOVASCULAR: Normal heart rate and rhythm, S1 and S2 normal without murmurs ABDOMEN: Soft, non-tender, non-distended, without organomegaly, normal bowel sounds EXTREMITIES: No cyanosis or edema NEUROLOGICAL: Cranial nerves grossly intact, moves all extremities without gross motor or sensory deficit SKIN: Plaque-like grayish rash on upper back extending to under the armpits      Labs reviewed: Recent Labs    09/25/23 2011 09/25/23 2035 01/25/24 1402  NA 137 138 138  K 3.6 3.8 4.5  CL 100 100 101  CO2 25  --  30  GLUCOSE 110* 107* 96  BUN 8 6* 7  CREATININE 0.88 1.10 1.06  CALCIUM  9.3  --  9.6   Recent Labs    09/25/23 2011 01/25/24 1402  AST 29 23  ALT 41 29  ALKPHOS 88  --   BILITOT 0.9 0.6  PROT 8.4* 7.6  ALBUMIN 4.2  --    Recent Labs    09/25/23 2011 09/25/23 2035 01/25/24 1402  WBC 11.2*  --  6.8  NEUTROABS 7.1  --  3,420  HGB 17.5* 18.4* 16.3  HCT 52.2*  54.0* 49.3  MCV 89.4  --  90.5  PLT 224  --  177   Lab Results  Component Value Date   TSH 1.19 01/25/2024   Lab Results  Component Value Date   HGBA1C 6.1 (H) 09/25/2023   Lab Results  Component Value Date   CHOL 91 01/25/2024   HDL 41 01/25/2024   LDLCALC 35 01/25/2024   TRIG 72 01/25/2024   CHOLHDL 2.2 01/25/2024    Significant Diagnostic Results in last 30 days:  No results found.  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 Previous treatment with Diflucan  and ketoconazole  cream has improved  the condition but not resolved it completely. Risk of reinfection from shared surfaces and items was discussed. - Prescribe Diflucan  150 mg once weekly for four weeks. - Prescribe ketoconazole  cream with three refills. - Advise moisturizing skin to prevent itching. - Instruct to avoid scratching to prevent spreading infection.  Onychomycosis  - Advise cleaning the shower with Clorox to prevent reinfection. - Instruct to avoid sharing shoes or slippers.  - Follow up with Podiatrist as scheduled   Hyperlipidemia - continue on Atorvastatin   - dietary modification and exercise advised   Immunization  - Influenza vaccine administered by CMA no reaction reported.   Family/ staff Communication: Reviewed plan of care with patient verbalized understanding   Labs/tests ordered: None   Next Appointment: Return if symptoms worsen or fail to improve.   Total time: minutes. Greater than 50% of total time spent doing patient education regarding Rash,HLD,Onychomycosis,health maintenance including symptom/medication management.   Roxan JAYSON Plough, NP

## 2024-02-20 ENCOUNTER — Other Ambulatory Visit: Payer: Self-pay

## 2024-05-06 ENCOUNTER — Encounter: Payer: Self-pay | Admitting: Neurology

## 2024-05-06 ENCOUNTER — Ambulatory Visit: Admitting: Neurology

## 2024-05-07 ENCOUNTER — Other Ambulatory Visit: Payer: Self-pay

## 2024-05-08 ENCOUNTER — Other Ambulatory Visit: Payer: Self-pay

## 2024-05-27 ENCOUNTER — Encounter: Payer: Self-pay | Admitting: Pharmacist

## 2024-05-28 NOTE — Progress Notes (Signed)
 Patient ID: Andrew Wang, male   DOB: 07-Jul-1955, 68 y.o.   MRN: [id number]  Pharmacy Quality Measure Review  This patient is appearing on a report for being at risk of failing the adherence measure for cholesterol (statin) and hypertension (ACEi/ARB) medications this calendar year.   Medication: Atorvastatin  and Lisinopril   Last fill date: 05/08/24 for 90 day supply (both)  Insurance report was not up to date. No action needed at this time.   [name], PharmD, BCACP Clinical Pharmacist [phone]

## 2024-06-20 NOTE — Progress Notes (Signed)
 Andrew Wang                                          MRN: [id number]   06/20/2024   The [facility] Quality Team Specialist reviewed this patient medical record for the purposes of chart review for care gap closure. The following were reviewed: abstraction for care gap closure-care for older adult medication review.    [facility] Quality Team

## 2024-07-28 ENCOUNTER — Ambulatory Visit: Payer: Self-pay | Admitting: Family
